# Patient Record
Sex: Female | Born: 1985 | Race: White | Hispanic: No | Marital: Single | Smoking: Former smoker
Health system: Southern US, Community
[De-identification: ages and names within clinical notes are randomized; demographics above are authoritative.]

## PROBLEM LIST (undated history)

## (undated) DIAGNOSIS — B1920 Unspecified viral hepatitis C without hepatic coma: Secondary | ICD-10-CM

## (undated) DIAGNOSIS — F419 Anxiety disorder, unspecified: Secondary | ICD-10-CM

## (undated) DIAGNOSIS — T7840XA Allergy, unspecified, initial encounter: Secondary | ICD-10-CM

## (undated) DIAGNOSIS — G43909 Migraine, unspecified, not intractable, without status migrainosus: Secondary | ICD-10-CM

## (undated) DIAGNOSIS — G8929 Other chronic pain: Secondary | ICD-10-CM

## (undated) HISTORY — DX: Unspecified viral hepatitis C without hepatic coma: B19.20

## (undated) HISTORY — DX: Anxiety disorder, unspecified: F41.9

## (undated) HISTORY — DX: Other chronic pain: G89.29

## (undated) HISTORY — DX: Migraine, unspecified, not intractable, without status migrainosus: G43.909

## (undated) HISTORY — DX: Allergy, unspecified, initial encounter: T78.40XA

## (undated) HISTORY — PX: APPENDECTOMY: SHX54

## (undated) HISTORY — PX: WISDOM TOOTH EXTRACTION: SHX21

---

## 2020-04-07 NOTE — Progress Notes (Signed)
New patient visit   Patient: Robin Whitaker   DOB: June 07, 1986   34 y.o. Female  MRN: [id number] Visit Date: 04/08/2020  Today's healthcare provider: Jairo BenMichelle Smith Elif Yonts, FNP   Chief Complaint  Patient presents with  . New Patient (Initial Visit)   Subjective    Robin Whitaker is a 34 y.o. female who presents today as a new patient to establish care.  History  of hepatitis C treated 3 years treated with [facility].  History of tattoo's got many in hotel rooms not sanitary and this was suspected to have caused.   She also has lower back pain chronically, she has history of kidney stones years ago.   She has some mild lower abdominal bloating. Last PAP unknown.  Denies any concerns for STD's at this time. Did have one previous unknown which one.   She will return here for PAP.   Denies alcohol. Quit smoking. Vapes occasionally for stress.   Patient  denies any fever, body aches,chills, rash, chest pain, shortness of breath, nausea, vomiting, or diarrhea.   Colonoscopy 1 year ago was normal per patient.  She had bloating then,records request.    Denies any loss of bowel or bladder control.  Denies saddle paresthesias.  Denies radiculopathy/ paresthesias.    Patient  denies any fever, chills, rash, chest pain, shortness of breath, nausea, vomiting, or diarrhea.  Denies dizziness, lightheadedness, pre syncopal or syncopal episodes.     Neck Pain  This is a chronic problem. The current episode started more than 1 year ago (woresned the past three months. Has seen chiropractor for neck pain for a couple of years. ). The problem occurs 2 to 4 times per day. The problem has been gradually worsening. The pain is associated with lifting a heavy object. The pain is present in the anterior neck. The quality of the pain is described as burning, cramping, shooting and aching (mostly aches ). The pain is at a severity of 6/10. The pain is moderate. The symptoms are aggravated by  bending and twisting. Stiffness is present in the morning. Associated symptoms include headaches (after neck pain persists and history of migraines ) and photophobia. Pertinent negatives include no chest pain, fever, leg pain, numbness, pain with swallowing, paresis, syncope, tingling, trouble swallowing, visual change, weakness or weight loss. Associated symptoms comments: scoliosis as child wore a brace only   She notices randomly she will see her pinky finger and occasionally her pointer finger moves on its own while driving.   She is using Gabapentin - she has had chronic numbness since her son was born. 2013. She has tried NSAIDs for the symptoms. The treatment provided no relief.      Past Medical History:  Diagnosis Date  . Allergy   . Anxiety   . Chronic back pain   . Migraine    Past Surgical History:  Procedure Laterality Date  . APPENDECTOMY    . WISDOM TOOTH EXTRACTION     Family Status  Relation Name Status  . Brother  (Not Specified)  . MGM  (Not Specified)  . MGF  (Not Specified)  . PGM  (Not Specified)   Family History  Problem Relation Age of Onset  . Leukemia Brother   . Arthritis Maternal Grandmother   . Anuerysm Maternal Grandfather   . Congestive Heart Failure Paternal Grandmother    Social History   Socioeconomic History  . Marital status: Single    Spouse name: Not on file  . Number  of children: Not on file  . Years of education: Not on file  . Highest education level: Not on file  Occupational History  . Not on file  Tobacco Use  . Smoking status: Never Smoker  . Smokeless tobacco: Current User  Substance and Sexual Activity  . Alcohol use: Not on file  . Drug use: Never  . Sexual activity: Not Currently    Partners: Male  Other Topics Concern  . Not on file  Social History Narrative  . Not on file   Social Determinants of Health   Financial Resource Strain:   . Difficulty of Paying Living Expenses:   Food Insecurity:   . Worried  About Programme researcher, broadcasting/film/video in the Last Year:   . Barista in the Last Year:   Transportation Needs:   . Freight forwarder (Medical):   Marland Kitchen Lack of Transportation (Non-Medical):   Physical Activity:   . Days of Exercise per Week:   . Minutes of Exercise per Session:   Stress:   . Feeling of Stress :   Social Connections:   . Frequency of Communication with Friends and Family:   . Frequency of Social Gatherings with Friends and Family:   . Attends Religious Services:   . Active Member of Clubs or Organizations:   . Attends Banker Meetings:   Marland Kitchen Marital Status:    No outpatient medications prior to visit.   No facility-administered medications prior to visit.   Allergies  Allergen Reactions  . Other     Trees, grass,dander     There is no immunization history on file for this patient.  Health Maintenance  Topic Date Due  . Hepatitis C Screening  Never done  . HIV Screening  Never done  . TETANUS/TDAP  Never done  . PAP SMEAR-Modifier  Never done  . INFLUENZA VACCINE  05/02/2020    Patient Care Team: Berniece Pap, FNP as PCP - General (Family Medicine)  Review of Systems  Constitutional: Positive for fatigue. Negative for fever and weight loss.  HENT: Negative for trouble swallowing.   Eyes: Positive for photophobia.  Cardiovascular: Negative for chest pain and syncope.  Gastrointestinal: Positive for abdominal distention and abdominal pain.  Genitourinary: Positive for flank pain and frequency.  Musculoskeletal: Positive for back pain, myalgias and neck pain.  Neurological: Positive for tremors and headaches (after neck pain persists and history of migraines ). Negative for tingling, weakness and numbness.  Psychiatric/Behavioral: Positive for sleep disturbance. The patient is nervous/anxious.       Objective    BP 110/72   Pulse 96   Temp (!) 96.8 F (36 C) (Oral)   Resp 16   Ht 5' 3.5" (1.613 m)   Wt 123 lb (55.8 kg)   LMP  03/13/2020 (Exact Date)   SpO2 100%   BMI 21.45 kg/m  Physical Exam Constitutional:      General: She is not in acute distress.    Appearance: She is normal weight. She is not ill-appearing, toxic-appearing or diaphoretic.  HENT:     Head: Normocephalic and atraumatic.     Right Ear: Tympanic membrane, ear canal and external ear normal. There is no impacted cerumen.     Left Ear: Tympanic membrane, ear canal and external ear normal. There is no impacted cerumen.     Nose: Nose normal. No congestion or rhinorrhea.     Mouth/Throat:     Mouth: Mucous membranes are moist.  Pharynx: No oropharyngeal exudate or posterior oropharyngeal erythema.  Eyes:     General: No scleral icterus.       Right eye: No discharge.        Left eye: No discharge.     Extraocular Movements: Extraocular movements intact.     Conjunctiva/sclera: Conjunctivae normal.     Pupils: Pupils are equal, round, and reactive to light.  Cardiovascular:     Rate and Rhythm: Normal rate and regular rhythm.     Pulses: Normal pulses.     Heart sounds: Normal heart sounds. No murmur heard.  No friction rub. No gallop.   Pulmonary:     Effort: Pulmonary effort is normal. No respiratory distress.     Breath sounds: Normal breath sounds. No stridor. No wheezing, rhonchi or rales.  Chest:     Chest wall: No tenderness.  Abdominal:     General: There is no distension.     Palpations: Abdomen is soft.     Tenderness: There is no abdominal tenderness.  Musculoskeletal:        General: Tenderness present. No swelling, deformity or signs of injury.     Cervical back: Normal range of motion and neck supple. Spasms and tenderness present. Normal range of motion.     Thoracic back: Tenderness present. No signs of trauma.     Lumbar back: Tenderness present. No edema. Normal range of motion. Negative right straight leg raise test and negative left straight leg raise test.     Right lower leg: Normal. No edema.     Left lower  leg: Normal. No edema.  Lymphadenopathy:     Cervical: No cervical adenopathy.     Lower Body: No right inguinal adenopathy. No left inguinal adenopathy.  Skin:    General: Skin is warm.     Capillary Refill: Capillary refill takes less than 2 seconds.     Comments: Very tiny 1 mm sized epidermal cyst suspected right anterior arm near wrist. No pain, erythema or drainage.   Neurological:     Mental Status: She is alert and oriented to person, place, and time.     Deep Tendon Reflexes:     Reflex Scores:      Tricep reflexes are 2+ on the right side and 2+ on the left side.      Brachioradialis reflexes are 2+ on the right side and 2+ on the left side.      Achilles reflexes are 2+ on the right side and 2+ on the left side. Psychiatric:        Mood and Affect: Mood is anxious.        Behavior: Behavior normal.        Thought Content: Thought content normal.        Judgment: Judgment normal.     Depression Screen PHQ 2/9 Scores 04/08/2020  PHQ - 2 Score 1  PHQ- 9 Score 5   Results for orders placed or performed in visit on 04/08/20  POCT urinalysis dipstick  Result Value Ref Range   Color, UA yellow    Clarity, UA clear    Glucose, UA Negative Negative   Bilirubin, UA negative    Ketones, UA negative    Spec Grav, UA 1.015 1.010 - 1.025   Blood, UA non hemolyzed trace    pH, UA 6.5 5.0 - 8.0   Protein, UA Negative Negative   Urobilinogen, UA 0.2 0.2 or 1.0 E.U./dL   Nitrite, UA negative  Leukocytes, UA Negative Negative   Appearance \    Odor      Assessment & Plan       Problem List Items Addressed This Visit      Cardiovascular and Mediastinum   Migraine without status migrainosus, not intractable   Relevant Orders   Ambulatory referral to Neurology    Other Visit Diagnoses    Chronic neck pain    -  Primary   Relevant Orders   Comprehensive metabolic panel   CBC with Differential/Platelet   Lipid panel   TSH   DG Cervical Spine 2 or 3 views (Completed)    DG Thoracic Spine 2 View (Completed)   DG Lumbar Spine 2-3 Views (Completed)   Ambulatory referral to Orthopedic Surgery   Screening for blood or protein in urine       Relevant Orders   POCT urinalysis dipstick (Completed)   History of carpal tunnel syndrome       History of scoliosis       Relevant Orders   Ambulatory referral to Orthopedic Surgery   Scoliosis, unspecified scoliosis type, unspecified spinal region       Relevant Orders   DG Cervical Spine 2 or 3 views (Completed)   DG Thoracic Spine 2 View (Completed)   DG Lumbar Spine 2-3 Views (Completed)   Acute midline low back pain with bilateral sciatica       Relevant Orders   Ambulatory referral to Orthopedic Surgery   Anxiety       Relevant Medications   hydrOXYzine (ATARAX/VISTARIL) 10 MG tablet     Orders Placed This Encounter  Procedures  . DG Cervical Spine 2 or 3 views    Order Specific Question:   Reason for Exam (SYMPTOM  OR DIAGNOSIS REQUIRED)    Answer:   cervical pain chronic    Order Specific Question:   Is patient pregnant?    Answer:   No    Order Specific Question:   Preferred imaging location?    Answer:   [facility]    Order Specific Question:   Radiology Contrast Protocol - do NOT remove file path    Answer:   \\[email or web address]\DXFluoroContrastProtocols.[email or web address]  . DG Thoracic Spine 2 View    Order Specific Question:   Reason for Exam (SYMPTOM  OR DIAGNOSIS REQUIRED)    Answer:   history scoliosis worse pain    Order Specific Question:   Is patient pregnant?    Answer:   No    Order Specific Question:   Preferred imaging location?    Answer:   [facility]    Order Specific Question:   Radiology Contrast Protocol - do NOT remove file path    Answer:   \\[email or web address]\Radiant\DXFluoroContrastProtocols.[email or web address]  . DG Lumbar Spine 2-3 Views    Order Specific Question:   Reason for Exam (SYMPTOM  OR DIAGNOSIS REQUIRED)    Answer:   history of scoliosis.    Order  Specific Question:   Is patient pregnant?    Answer:   No    Order Specific Question:   Preferred imaging location?    Answer:   [facility]    Order Specific Question:   Radiology Contrast Protocol - do NOT remove file path    Answer:   \\[email or web address]\Radiant\DXFluoroContrastProtocols.pdf  . Comprehensive metabolic panel  . CBC with Differential/Platelet  . Lipid panel  . TSH  . Ambulatory referral to Orthopedic Surgery    Referral Priority:   Urgent    Referral  Type:   Surgical    Referral Reason:   Specialty Services Required    Requested Specialty:   Orthopedic Surgery    Number of Visits Requested:   1  . Ambulatory referral to Neurology    Referral Priority:   Routine    Referral Type:   Consultation    Referral Reason:   Patient Preference    Referred to Provider:   Lonell Face, MD    Requested Specialty:   Neurology    Number of Visits Requested:   1  . POCT urinalysis dipstick    Meds ordered this encounter  Medications  . hydrOXYzine (ATARAX/VISTARIL) 10 MG tablet    Sig: Take 1 tablet (10 mg total) by mouth 3 (three) times daily as needed for anxiety (may cause drowsiness).    Dispense:  30 tablet    Refill:  0   As above for anxiety.    Red Flags discussed. The patient was given clear instructions to go to ER or return to medical center if any red flags develop, symptoms do not improve, worsen or new problems develop. They verbalized understanding.  For full CPE and PAP.Marland Kitchen Return in about 1 month (around 05/09/2020), or if symptoms worsen or fail to improve, for at any time for any worsening symptoms, Go to Emergency room/ urgent care if worse.     IBeverely Pace Gillis Boardley, FNP, have reviewed all documentation for this visit. The documentation on 04/09/20 for the exam, diagnosis, procedures, and orders are all accurate and complete.   Jairo Ben, FNP  [facility] [phone] (phone) [phone] (fax)  [facility]

## 2020-04-08 ENCOUNTER — Ambulatory Visit (INDEPENDENT_AMBULATORY_CARE_PROVIDER_SITE_OTHER): Payer: Medicaid Other | Admitting: Adult Health

## 2020-04-08 ENCOUNTER — Encounter: Payer: Self-pay | Admitting: Adult Health

## 2020-04-08 ENCOUNTER — Other Ambulatory Visit: Payer: Self-pay

## 2020-04-08 ENCOUNTER — Ambulatory Visit
Admission: RE | Admit: 2020-04-08 | Discharge: 2020-04-08 | Disposition: A | Discharge status: Home / Self Care | Payer: Medicaid Other | Attending: Adult Health | Admitting: Adult Health

## 2020-04-08 ENCOUNTER — Ambulatory Visit
Admission: RE | Admit: 2020-04-08 | Discharge: 2020-04-08 | Disposition: A | Discharge status: Home / Self Care | Payer: Medicaid Other | Source: Ambulatory Visit | Attending: Adult Health | Admitting: Adult Health

## 2020-04-08 VITALS — BP 110/72 | HR 96 | Temp 96.8°F | Resp 16 | Ht 63.5 in | Wt 123.0 lb

## 2020-04-08 DIAGNOSIS — M419 Scoliosis, unspecified: Secondary | ICD-10-CM | POA: Diagnosis present

## 2020-04-08 DIAGNOSIS — Z8739 Personal history of other diseases of the musculoskeletal system and connective tissue: Secondary | ICD-10-CM | POA: Diagnosis not present

## 2020-04-08 DIAGNOSIS — M5442 Lumbago with sciatica, left side: Secondary | ICD-10-CM

## 2020-04-08 DIAGNOSIS — G8929 Other chronic pain: Secondary | ICD-10-CM | POA: Insufficient documentation

## 2020-04-08 DIAGNOSIS — M5441 Lumbago with sciatica, right side: Secondary | ICD-10-CM

## 2020-04-08 DIAGNOSIS — M542 Cervicalgia: Secondary | ICD-10-CM | POA: Diagnosis present

## 2020-04-08 DIAGNOSIS — Z8669 Personal history of other diseases of the nervous system and sense organs: Secondary | ICD-10-CM

## 2020-04-08 DIAGNOSIS — Z1389 Encounter for screening for other disorder: Secondary | ICD-10-CM | POA: Diagnosis not present

## 2020-04-08 DIAGNOSIS — F419 Anxiety disorder, unspecified: Secondary | ICD-10-CM

## 2020-04-08 DIAGNOSIS — G43909 Migraine, unspecified, not intractable, without status migrainosus: Secondary | ICD-10-CM

## 2020-04-08 LAB — POCT URINALYSIS DIPSTICK
Bilirubin, UA: NEGATIVE
Glucose, UA: NEGATIVE
Ketones, UA: NEGATIVE
Leukocytes, UA: NEGATIVE
Nitrite, UA: NEGATIVE
Protein, UA: NEGATIVE
Spec Grav, UA: 1.015 (ref 1.010–1.025)
Urobilinogen, UA: 0.2 E.U./dL
pH, UA: 6.5 (ref 5.0–8.0)

## 2020-04-08 MED ORDER — HYDROXYZINE HCL 10 MG PO TABS: 10.0000 mg | ORAL_TABLET | Freq: Three times a day (TID) | ORAL | 0 refills | Status: DC | PRN

## 2020-04-08 NOTE — Patient Instructions (Addendum)
Hydroxyzine capsules or tablets What is this medicine? HYDROXYZINE (hye Chelsea i zeen) is an antihistamine. This medicine is used to treat allergy symptoms. It is also used to treat anxiety and tension. This medicine can be used with other medicines to induce sleep before surgery. This medicine may be used for other purposes; ask your health care provider or pharmacist if you have questions. COMMON BRAND NAME(S): ANX, Atarax, Rezine, Vistaril What should I tell my health care provider before I take this medicine? They need to know if you have any of these conditions:  glaucoma  heart disease  history of irregular heartbeat  kidney disease  liver disease  lung or breathing disease, like asthma  stomach or intestine problems  thyroid disease  trouble passing urine  an unusual or allergic reaction to hydroxyzine, cetirizine, other medicines, foods, dyes or preservatives  pregnant or trying to get pregnant  breast-feeding How should I use this medicine? Take this medicine by mouth with a full glass of water. Follow the directions on the prescription label. You may take this medicine with food or on an empty stomach. Take your medicine at regular intervals. Do not take your medicine more often than directed. Talk to your pediatrician regarding the use of this medicine in children. Special care may be needed. While this drug may be prescribed for children as young as 38 years of age for selected conditions, precautions do apply. Patients over 15 years old may have a stronger reaction and need a smaller dose. Overdosage: If you think you have taken too much of this medicine contact a poison control center or emergency room at once. NOTE: This medicine is only for you. Do not share this medicine with others. What if I miss a dose? If you miss a dose, take it as soon as you can. If it is almost time for your next dose, take only that dose. Do not take double or extra doses. What may  interact with this medicine? Do not take this medicine with any of the following medications:  cisapride  dronedarone  pimozide  thioridazine This medicine may also interact with the following medications:  alcohol  antihistamines for allergy, cough, and cold  atropine  barbiturate medicines for sleep or seizures, like phenobarbital  certain antibiotics like erythromycin or clarithromycin  certain medicines for anxiety or sleep  certain medicines for bladder problems like oxybutynin, tolterodine  certain medicines for depression or psychotic disturbances  certain medicines for irregular heart beat  certain medicines for Parkinson's disease like benztropine, trihexyphenidyl  certain medicines for seizures like phenobarbital, primidone  certain medicines for stomach problems like dicyclomine, hyoscyamine  certain medicines for travel sickness like scopolamine  ipratropium  narcotic medicines for pain  other medicines that prolong the QT interval (an abnormal heart rhythm) like dofetilide This list may not describe all possible interactions. Give your health care provider a list of all the medicines, herbs, non-prescription drugs, or dietary supplements you use. Also tell them if you smoke, drink alcohol, or use illegal drugs. Some items may interact with your medicine. What should I watch for while using this medicine? Tell your doctor or health care professional if your symptoms do not improve. You may get drowsy or dizzy. Do not drive, use machinery, or do anything that needs mental alertness until you know how this medicine affects you. Do not stand or sit up quickly, especially if you are an older patient. This reduces the risk of dizzy or fainting spells. Alcohol may  interfere with the effect of this medicine. Avoid alcoholic drinks. Your mouth may get dry. Chewing sugarless gum or sucking hard candy, and drinking plenty of water may help. Contact your doctor if the  problem does not go away or is severe. This medicine may cause dry eyes and blurred vision. If you wear contact lenses you may feel some discomfort. Lubricating drops may help. See your eye doctor if the problem does not go away or is severe. If you are receiving skin tests for allergies, tell your doctor you are using this medicine. What side effects may I notice from receiving this medicine? Side effects that you should report to your doctor or health care professional as soon as possible:  allergic reactions like skin rash, itching or hives, swelling of the face, lips, or tongue  changes in vision  confusion  fast, irregular heartbeat  seizures  tremor  trouble passing urine or change in the amount of urine Side effects that usually do not require medical attention (report to your doctor or health care professional if they continue or are bothersome):  constipation  drowsiness  dry mouth  headache  tiredness This list may not describe all possible side effects. Call your doctor for medical advice about side effects. You may report side effects to FDA at 1-800-FDA-1088. Where should I keep my medicine? Keep out of the reach of children. Store at room temperature between 15 and 30 degrees C (59 and 86 degrees F). Keep container tightly closed. Throw away any unused medicine after the expiration date. NOTE: This sheet is a summary. It may not cover all possible information. If you have questions about this medicine, talk to your doctor, pharmacist, or health care provider.  2020 Elsevier/Gold Standard (2018-09-09 13:19:55) Cervical Radiculopathy  Cervical radiculopathy means that a nerve in the neck (a cervical nerve) is pinched or bruised. This can happen because of an injury to the cervical spine (vertebrae) in the neck, or as a normal part of getting older. This can cause pain or loss of feeling (numbness) that runs from your neck all the way down to your arm and fingers.  Often, this condition gets better with rest. Treatment may be needed if the condition does not get better. What are the causes?  A neck injury.  A bulging disk in your spine.  Muscle movements that you cannot control (muscle spasms).  Tight muscles in your neck due to overuse.  Arthritis.  Breakdown in the bones and joints of the spine (spondylosis) due to getting older.  Bone spurs that form near the nerves in the neck. What are the signs or symptoms?  Pain. The pain may: ? Run from the neck to the arm and hand. ? Be very bad or irritating. ? Be worse when you move your neck.  Loss of feeling or tingling in your arm or hand.  Weakness in your arm or hand, in very bad cases. How is this treated? In many cases, treatment is not needed for this condition. With rest, the condition often gets better over time. If treatment is needed, options may include:  Wearing a soft neck collar (cervical collar) for short periods of time, as told by your doctor.  Doing exercises (physical therapy) to strengthen your neck muscles.  Taking medicines.  Having shots (injections) in your spine, in very bad cases.  Having surgery. This may be needed if other treatments do not help. The type of surgery that is used depends on the cause of  your condition. Follow these instructions at home: If you have a soft neck collar:  Wear it as told by your doctor. Remove it only as told by your doctor.  Ask your doctor if you can remove the collar for cleaning and bathing. If you are allowed to remove the collar for cleaning or bathing: ? Follow instructions from your doctor about how to remove the collar safely. ? Clean the collar by wiping it with mild soap and water and drying it completely. ? Take out any removable pads in the collar every 1-2 days. Wash them by hand with soap and water. Let them air-dry completely before you put them back in the collar. ? Check your skin under the collar for redness  or sores. If you see any, tell your doctor. Managing pain      Take over-the-counter and prescription medicines only as told by your doctor.  If told, put ice on the painful area. ? If you have a soft neck collar, remove it as told by your doctor. ? Put ice in a plastic bag. ? Place a towel between your skin and the bag. ? Leave the ice on for 20 minutes, 2-3 times a day.  If using ice does not help, you can try using heat. Use the heat source that your doctor recommends, such as a moist heat pack or a heating pad. ? Place a towel between your skin and the heat source. ? Leave the heat on for 20-30 minutes. ? Remove the heat if your skin turns bright red. This is very important if you are unable to feel pain, heat, or cold. You may have a greater risk of getting burned.  You may try a gentle neck and shoulder rub (massage). Activity  Rest as needed.  Return to your normal activities as told by your doctor. Ask your doctor what activities are safe for you.  Do exercises as told by your doctor or physical therapist.  Do not lift anything that is heavier than 10 lb (4.5 kg) until your doctor tells you that it is safe. General instructions  Use a flat pillow when you sleep.  Do not drive while wearing a soft neck collar. If you do not have a soft neck collar, ask your doctor if it is safe to drive while your neck heals.  Ask your doctor if the medicine prescribed to you requires you to avoid driving or using heavy machinery.  Do not use any products that contain nicotine or tobacco, such as cigarettes, e-cigarettes, and chewing tobacco. These can delay healing. If you need help quitting, ask your doctor.  Keep all follow-up visits as told by your doctor. This is important. Contact a doctor if:  Your condition does not get better with treatment. Get help right away if:  Your pain gets worse and is not helped with medicine.  You lose feeling or feel weak in your hand, arm,  face, or leg.  You have a high fever.  You have a stiff neck.  You cannot control when you poop or pee (have incontinence).  You have trouble with walking, balance, or talking. Summary  Cervical radiculopathy means that a nerve in the neck is pinched or bruised.  A nerve can get pinched from a bulging disk, arthritis, an injury to the neck, or other causes.  Symptoms include pain, tingling, or loss of feeling that goes from the neck into the arm or hand.  Weakness in your arm or hand can happen  in very bad cases.  Treatment may include resting, wearing a soft neck collar, and doing exercises. You might need to take medicines for pain. In very bad cases, shots or surgery may be needed. This information is not intended to replace advice given to you by your health care provider. Make sure you discuss any questions you have with your health care provider. Document Revised: 08/09/2018 Document Reviewed: 08/09/2018 Elsevier Patient Education  2020 [employer]. Social Anxiety Disorder, Adult Social anxiety disorder (SAD), previously called social phobia, is a mental health condition. People with SAD often feel nervous, afraid, or embarrassed when they are around other people in social situations. They worry that other people are judging or criticizing them for how they look, what they say, or how they act. SAD involves more than just feeling shy or self-conscious at times. It can cause severe emotional distress. It can interfere with activities of daily life. SAD also may lead to alcohol or drug use, and even suicide. SAD is a common mental health condition. It can develop at any time, but it usually starts in the teenage years. What are the causes? The cause of this condition is not known. It may involve genes that are passed through families. Stressful events may trigger anxiety. This disorder is also associated with an overactive amygdala. The amygdala is the part of the brain that  triggers your response to strong feelings, such as fear. What increases the risk? This condition is more likely to develop in:  People who have a family history of anxiety disorders.  Women.  People who have a physical or behavioral condition that makes them feel self-conscious or nervous, such as a stutter or a long-term (chronic) disease. What are the signs or symptoms? The main symptom of this condition is fear of embarrassment caused by being criticized or judged in social situations. You may be afraid to:  Speak in public.  Go shopping.  Use a public bathroom.  Eat at a restaurant.  Go to work.  Interact with people you do not know. Extreme fear and anxiety may cause physical symptoms, including:  Blushing.  A fast heartbeat.  Sweating.  Shaky hands or voice.  Confusion.  Light-headedness.  Upset stomach, diarrhea, or vomiting.  Shortness of breath. How is this diagnosed? This condition is diagnosed based on your history, symptoms, and behavior in social situations. You may be diagnosed with this type of anxiety if your symptoms have lasted for more than 6 months and have been present on more days than not. Your health care provider may ask you about your use of alcohol, drugs, and prescription medicines. He or she may also refer you to a mental health specialist for further evaluation or treatment. How is this treated? Treatment for this condition may include:  Cognitive behavioral therapy (CBT). This type of talk therapy helps you learn to replace negative thoughts and behaviors with positive ones. This may include learning how to use self-calming skills and other methods of managing your anxiety.  Exposure therapy. You will be exposed to social situations that cause you fear. The treatment starts with practicing self-calming in situations that cause you low levels of fear. Over time, you will progress by sustaining self-calming and managing harder  situations.  Antidepressant medicines. These medicines may be used by themselves or in addition to other therapies.  Biofeedback. This process trains you to manage your body's response (physiological response) through breathing techniques and relaxation methods. You will work with a Paramedic while  machines are used to monitor your physical symptoms.  Techniques for relaxation and managing anxiety. These include deep breathing, self-talk, meditation, visual imagery, muscle relaxation, music therapy, and yoga. These techniques are often used with other therapies to keep you calm in situations that cause you anxiety. These treatments are often used in combination. Follow these instructions at home: Alcohol use If you drink alcohol:  Limit how much you use to: ? 0-1 drink a day for nonpregnant women. ? 0-2 drinks a day for men.  Be aware of how much alcohol is in your drink. In the [place] one drink equals one 12 oz bottle of beer (355 mL), one 5 oz glass of wine (148 mL), or one 1 oz glass of hard liquor (44 mL). General instructions  Take over-the-counter and prescription medicines only as told by your health care provider.  Practice techniques for relaxation and managing anxiety at times you are not challenged by social anxiety.  Return to social activities using techniques you have learned, as you feel ready to do so.  Avoid caffeine and certain over-the-counter cold medicines. These may make you feel worse. Ask your pharmacists which medicines to avoid.  Keep all follow-up visits as told by your health care provider. This is important. Where to find more information  The [employer] on Mental Illness (NAMI): https://www.nami.org  Social Anxiety Association: https://socialphobia.org  Mental Health America [facility]): https://www.stevens-henderson.com/  Anxiety and Depression Association of [place] (ADAA): http://miller-hamilton.net/ Contact a health care provider if:  Your symptoms do not improve  or get worse.  You have signs of depression, such as: ? Persistent sadness or moodiness. ? Loss of enjoyment in activities that used to bring you joy. ? Change in weight or eating. ? Changes in sleeping habits. ? Avoiding friends or family members more than usual. ? Loss of energy for normal tasks. ? Feeling guilty or worthless.  You become more isolated than you normally are.  You find it more and more difficult to speak or interact with others.  You are using drugs.  You are drinking more alcohol than usual. Get help right away if:  You harm yourself.  You have suicidal thoughts. If you ever feel like you may hurt yourself or others, or have thoughts about taking your own life, get help right away. You can go to your nearest emergency department or call:  Your local emergency services (911 in the [place]  A suicide crisis helpline, such as the National Suicide Prevention Lifeline at (380) 655-8725. This is open 24 hours a day. Summary  Social anxiety disorder (SAD) may cause you to feel nervous, afraid, or embarrassed when you are around other people in social situations.  SAD is a common mental disorder. It can develop at any time, but it usually starts in the teenage years.  Treatment includes talk therapy, exposure therapy, medicines, biofeedback, and relaxation techniques. It can involve a combination of treatments. This information is not intended to replace advice given to you by your health care provider. Make sure you discuss any questions you have with your health care provider. Document Revised: 02/19/2019 Document Reviewed: 02/19/2019 Elsevier Patient Education  2020 [employer]. Health Maintenance, Female Adopting a healthy lifestyle and getting preventive care are important in promoting health and wellness. Ask your health care provider about:  The right schedule for you to have regular tests and exams.  Things you can do on your own to prevent diseases and  keep yourself healthy. What should I know about diet, weight,  and exercise? Eat a healthy diet   Eat a diet that includes plenty of vegetables, fruits, low-fat dairy products, and lean protein.  Do not eat a lot of foods that are high in solid fats, added sugars, or sodium. Maintain a healthy weight Body mass index (BMI) is used to identify weight problems. It estimates body fat based on height and weight. Your health care provider can help determine your BMI and help you achieve or maintain a healthy weight. Get regular exercise Get regular exercise. This is one of the most important things you can do for your health. Most adults should:  Exercise for at least 150 minutes each week. The exercise should increase your heart rate and make you sweat (moderate-intensity exercise).  Do strengthening exercises at least twice a week. This is in addition to the moderate-intensity exercise.  Spend less time sitting. Even light physical activity can be beneficial. Watch cholesterol and blood lipids Have your blood tested for lipids and cholesterol at 32 years of age, then have this test every 5 years. Have your cholesterol levels checked more often if:  Your lipid or cholesterol levels are high.  You are older than 32 years of age.  You are at high risk for heart disease. What should I know about cancer screening? Depending on your health history and family history, you may need to have cancer screening at various ages. This may include screening for:  Breast cancer.  Cervical cancer.  Colorectal cancer.  Skin cancer.  Lung cancer. What should I know about heart disease, diabetes, and high blood pressure? Blood pressure and heart disease  High blood pressure causes heart disease and increases the risk of stroke. This is more likely to develop in people who have high blood pressure readings, are of African descent, or are overweight.  Have your blood pressure checked: ? Every 3-5  years if you are 3918-32 years of age. ? Every year if you are 32 years old or older. Diabetes Have regular diabetes screenings. This checks your fasting blood sugar level. Have the screening done:  Once every three years after age 32 if you are at a normal weight and have a low risk for diabetes.  More often and at a younger age if you are overweight or have a high risk for diabetes. What should I know about preventing infection? Hepatitis B If you have a higher risk for hepatitis B, you should be screened for this virus. Talk with your health care provider to find out if you are at risk for hepatitis B infection. Hepatitis C Testing is recommended for:  Everyone born from [id number] through 1963.  Anyone with known risk factors for hepatitis C. Sexually transmitted infections (STIs)  Get screened for STIs, including gonorrhea and chlamydia, if: ? You are sexually active and are younger than 32 years of age. ? You are older than 32 years of age and your health care provider tells you that you are at risk for this type of infection. ? Your sexual activity has changed since you were last screened, and you are at increased risk for chlamydia or gonorrhea. Ask your health care provider if you are at risk.  Ask your health care provider about whether you are at high risk for HIV. Your health care provider may recommend a prescription medicine to help prevent HIV infection. If you choose to take medicine to prevent HIV, you should first get tested for HIV. You should then be tested every 3 months for  as long as you are taking the medicine. Pregnancy  If you are about to stop having your period (premenopausal) and you may become pregnant, seek counseling before you get pregnant.  Take 400 to 800 micrograms (mcg) of folic acid every day if you become pregnant.  Ask for birth control (contraception) if you want to prevent pregnancy. Osteoporosis and menopause Osteoporosis is a disease in which the  bones lose minerals and strength with aging. This can result in bone fractures. If you are 81 years old or older, or if you are at risk for osteoporosis and fractures, ask your health care provider if you should:  Be screened for bone loss.  Take a calcium or vitamin D supplement to lower your risk of fractures.  Be given hormone replacement therapy (HRT) to treat symptoms of menopause. Follow these instructions at home: Lifestyle  Do not use any products that contain nicotine or tobacco, such as cigarettes, e-cigarettes, and chewing tobacco. If you need help quitting, ask your health care provider.  Do not use street drugs.  Do not share needles.  Ask your health care provider for help if you need support or information about quitting drugs. Alcohol use  Do not drink alcohol if: ? Your health care provider tells you not to drink. ? You are pregnant, may be pregnant, or are planning to become pregnant.  If you drink alcohol: ? Limit how much you use to 0-1 drink a day. ? Limit intake if you are breastfeeding.  Be aware of how much alcohol is in your drink. In the [place] one drink equals one 12 oz bottle of beer (355 mL), one 5 oz glass of wine (148 mL), or one 1 oz glass of hard liquor (44 mL). General instructions  Schedule regular health, dental, and eye exams.  Stay current with your vaccines.  Tell your health care provider if: ? You often feel depressed. ? You have ever been abused or do not feel safe at home. Summary  Adopting a healthy lifestyle and getting preventive care are important in promoting health and wellness.  Follow your health care provider's instructions about healthy diet, exercising, and getting tested or screened for diseases.  Follow your health care provider's instructions on monitoring your cholesterol and blood pressure. This information is not intended to replace advice given to you by your health care provider. Make sure you discuss any  questions you have with your health care provider. Document Revised: 09/11/2018 Document Reviewed: 09/11/2018 Elsevier Patient Education  2020 [employer]vier Inc. Epidermal Cyst  An epidermal cyst is a sac made of skin tissue. The sac contains a substance called keratin. Keratin is a protein that is normally secreted through the hair follicles. When keratin becomes trapped in the top layer of skin (epidermis), it can form an epidermal cyst. Epidermal cysts can be found anywhere on your body. These cysts are usually harmless (benign), and they may not cause symptoms unless they become infected. What are the causes? This condition may be caused by:  A blocked hair follicle.  A hair that curls and re-enters the skin instead of growing straight out of the skin (ingrown hair).  A blocked pore.  Irritated skin.  An injury to the skin.  Certain conditions that are passed along from parent to child (inherited).  Human papillomavirus (HPV).  Long-term (chronic) sun damage to the skin. What increases the risk? The following factors may make you more likely to develop an epidermal cyst:  Having acne.  Being  overweight.  Being 29-49 years old. What are the signs or symptoms? The only symptom of this condition may be a small, painless lump underneath the skin. When an epidermal cyst ruptures, it may become infected. Symptoms may include:  Redness.  Inflammation.  Tenderness.  Warmth.  Fever.  Keratin draining from the cyst. Keratin is grayish-white, bad-smelling substance.  Pus draining from the cyst. How is this diagnosed? This condition is diagnosed with a physical exam.  In some cases, you may have a sample of tissue (biopsy) taken from your cyst to be examined under a microscope or tested for bacteria.  You may be referred to a health care provider who specializes in skin care (dermatologist). How is this treated? In many cases, epidermal cysts go away on their own without  treatment. If a cyst becomes infected, treatment may include:  Opening and draining the cyst, done by a health care provider. After draining, minor surgery to remove the rest of the cyst may be done.  Antibiotic medicine.  Injections of medicines (steroids) that help to reduce inflammation.  Surgery to remove the cyst. Surgery may be done if the cyst: ? Becomes large. ? Bothers you. ? Has a chance of turning into cancer.  Do not try to open a cyst yourself. Follow these instructions at home:  Take over-the-counter and prescription medicines only as told by your health care provider.  If you were prescribed an antibiotic medicine, take it it as told by your health care provider. Do not stop using the antibiotic even if you start to feel better.  Keep the area around your cyst clean and dry.  Wear loose, dry clothing.  Avoid touching your cyst.  Check your cyst every day for signs of infection. Check for: ? Redness, swelling, or pain. ? Fluid or blood. ? Warmth. ? Pus or a bad smell.  Keep all follow-up visits as told by your health care provider. This is important. How is this prevented?  Wear clean, dry, clothing.  Avoid wearing tight clothing.  Keep your skin clean and dry. Take showers or baths every day. Contact a health care provider if:  Your cyst develops symptoms of infection.  Your condition is not improving or is getting worse.  You develop a cyst that looks different from other cysts you have had.  You have a fever. Get help right away if:  Redness spreads from the cyst into the surrounding area. Summary  An epidermal cyst is a sac made of skin tissue. These cysts are usually harmless (benign), and they may not cause symptoms unless they become infected.  If a cyst becomes infected, treatment may include surgery to open and drain the cyst, or to remove it. Treatment may also include medicines by mouth or through an injection.  Take over-the-counter  and prescription medicines only as told by your health care provider. If you were prescribed an antibiotic medicine, take it as told by your health care provider. Do not stop using the antibiotic even if you start to feel better.  Contact a health care provider if your condition is not improving or is getting worse.  Keep all follow-up visits as told by your health care provider. This is important. This information is not intended to replace advice given to you by your health care provider. Make sure you discuss any questions you have with your health care provider. Document Revised: 01/09/2019 Document Reviewed: 04/01/2018 Elsevier Patient Education  2020 [employer].

## 2020-04-09 ENCOUNTER — Encounter: Payer: Self-pay | Admitting: Adult Health

## 2020-04-09 DIAGNOSIS — G8929 Other chronic pain: Secondary | ICD-10-CM | POA: Insufficient documentation

## 2020-04-09 DIAGNOSIS — F419 Anxiety disorder, unspecified: Secondary | ICD-10-CM | POA: Insufficient documentation

## 2020-04-09 DIAGNOSIS — Z8739 Personal history of other diseases of the musculoskeletal system and connective tissue: Secondary | ICD-10-CM | POA: Insufficient documentation

## 2020-04-09 DIAGNOSIS — M5442 Lumbago with sciatica, left side: Secondary | ICD-10-CM | POA: Insufficient documentation

## 2020-04-09 DIAGNOSIS — M5441 Lumbago with sciatica, right side: Secondary | ICD-10-CM | POA: Insufficient documentation

## 2020-04-09 DIAGNOSIS — M419 Scoliosis, unspecified: Secondary | ICD-10-CM | POA: Insufficient documentation

## 2020-04-09 DIAGNOSIS — Z1389 Encounter for screening for other disorder: Secondary | ICD-10-CM | POA: Insufficient documentation

## 2020-04-12 ENCOUNTER — Other Ambulatory Visit: Payer: Self-pay | Admitting: Adult Health

## 2020-04-12 ENCOUNTER — Telehealth: Payer: Self-pay

## 2020-04-12 DIAGNOSIS — M62838 Other muscle spasm: Secondary | ICD-10-CM

## 2020-04-12 MED ORDER — CYCLOBENZAPRINE HCL 10 MG PO TABS
10.0000 mg | ORAL_TABLET | Freq: Every evening | ORAL | 0 refills | Status: DC | PRN
Start: 2020-04-12 — End: 2021-06-28

## 2020-04-12 NOTE — Telephone Encounter (Deleted)
-----   Message from Michelle S Flinchum, FNP sent at 04/09/2020  4:29 PM EDT ----- Cervical x ray with muscle spasm of neck.  Thoracic with mild scoliosis.  Lumbar mild scoliosis.   Try muscle relaxer that was given at office visit and keep follow up with orthopedics as referred for chronic pain spine.  ----- Message ----- From: Interface, Rad Results In Sent: 04/09/2020   3:50 PM EDT To: Michelle S Flinchum, FNP  

## 2020-04-12 NOTE — Telephone Encounter (Signed)
-----   Message from Berniece Pap, FNP sent at 04/09/2020  4:29 PM EDT ----- Cervical x ray with muscle spasm of neck.  Thoracic with mild scoliosis.  Lumbar mild scoliosis.   Try muscle relaxer that was given at office visit and keep follow up with orthopedics as referred for chronic pain spine.  ----- Message ----- From: Interface, Rad Results In Sent: 04/09/2020   3:50 PM EDT To: Berniece Pap, FNP

## 2020-04-12 NOTE — Telephone Encounter (Signed)
Sent Flexeril 10mg  po qhs PRN to pharmacy.

## 2020-04-12 NOTE — Telephone Encounter (Signed)
Patient advised. She states muscle relaxer was not sent to [facility] pharmacy. Please send RX.

## 2020-04-12 NOTE — Telephone Encounter (Deleted)
-----   Message from Berniece Pap, FNP sent at 04/09/2020  4:29 PM EDT ----- Cervical x ray with muscle spasm of neck.  Thoracic with mild scoliosis.  Lumbar mild scoliosis.   Try muscle relaxer that was given at office visit and keep follow up with orthopedics as referred for chronic pain spine.  ----- Message ----- From: Interface, Rad Results In Sent: 04/09/2020   3:49 PM EDT To: Berniece Pap, FNP

## 2020-04-13 ENCOUNTER — Telehealth: Payer: Self-pay

## 2020-04-13 DIAGNOSIS — N912 Amenorrhea, unspecified: Secondary | ICD-10-CM

## 2020-04-13 NOTE — Telephone Encounter (Signed)
Spoke with patient on the phone and reassured her that U/A in office was normal there was no traces of bacteria or elevation in her white blood cell count. Patient reports that she is still feeling very bloated and feeling fatigued. Patient states that her last menstrual cycle was over 30 days ago but states that she is not sexually active and has no concerns of pregnancy. Patient has not went to lab yet to have labs drawn that you ordered. Did you want me to add any additional labs to these orders? KW

## 2020-04-13 NOTE — Telephone Encounter (Signed)
Left message for patient to call  Office back. KW

## 2020-04-13 NOTE — Telephone Encounter (Signed)
Ok to add FSH/ LH and  beta HCG lab diagnosis missed menses.  Follow up in office if symptoms persist advised.

## 2020-04-13 NOTE — Addendum Note (Signed)
Addended by: Fonda Kinder on: 04/13/2020 04:37 PM   Modules accepted: Orders

## 2020-04-13 NOTE — Telephone Encounter (Signed)
Copied from CRM [id number]. Topic: General - Other >> Apr 13, 2020 10:41 AM Dalphine Handing A wrote: Patient wanting a callback to go over urinalysis results. Please advise

## 2020-04-14 DIAGNOSIS — R519 Headache, unspecified: Secondary | ICD-10-CM | POA: Insufficient documentation

## 2020-04-19 ENCOUNTER — Other Ambulatory Visit: Payer: Self-pay | Admitting: Adult Health

## 2020-04-20 LAB — CBC WITH DIFFERENTIAL/PLATELET
Basophils Absolute: 0.1 10*3/uL (ref 0.0–0.2)
Basos: 1 %
EOS (ABSOLUTE): 0.3 10*3/uL (ref 0.0–0.4)
Eos: 3 %
Hematocrit: 36.8 % (ref 34.0–46.6)
Hemoglobin: 12.7 g/dL (ref 11.1–15.9)
Immature Grans (Abs): 0 10*3/uL (ref 0.0–0.1)
Immature Granulocytes: 0 %
Lymphocytes Absolute: 2 10*3/uL (ref 0.7–3.1)
Lymphs: 24 %
MCH: 32.1 pg (ref 26.6–33.0)
MCHC: 34.5 g/dL (ref 31.5–35.7)
MCV: 93 fL (ref 79–97)
Monocytes Absolute: 0.4 10*3/uL (ref 0.1–0.9)
Monocytes: 5 %
Neutrophils Absolute: 5.6 10*3/uL (ref 1.4–7.0)
Neutrophils: 67 %
Platelets: 303 10*3/uL (ref 150–450)
RBC: 3.96 x10E6/uL (ref 3.77–5.28)
RDW: 12 % (ref 11.7–15.4)
WBC: 8.4 10*3/uL (ref 3.4–10.8)

## 2020-04-20 LAB — COMPREHENSIVE METABOLIC PANEL
ALT: 11 IU/L (ref 0–32)
AST: 14 IU/L (ref 0–40)
Albumin/Globulin Ratio: 2 (ref 1.2–2.2)
Albumin: 4.5 g/dL (ref 3.8–4.8)
Alkaline Phosphatase: 70 IU/L (ref 48–121)
BUN/Creatinine Ratio: 7 — ABNORMAL LOW (ref 9–23)
BUN: 7 mg/dL (ref 6–20)
Bilirubin Total: <0.2 mg/dL (ref 0.0–1.2)
CO2: 24 mmol/L (ref 20–29)
Calcium: 9.6 mg/dL (ref 8.7–10.2)
Chloride: 100 mmol/L (ref 96–106)
Creatinine, Ser: 0.97 mg/dL (ref 0.57–1.00)
GFR calc Af Amer: 89 mL/min/{1.73_m2} (ref >59)
GFR calc non Af Amer: 78 mL/min/{1.73_m2} (ref >59)
Globulin, Total: 2.2 g/dL (ref 1.5–4.5)
Glucose: 108 mg/dL — ABNORMAL HIGH (ref 65–99)
Potassium: 4 mmol/L (ref 3.5–5.2)
Sodium: 141 mmol/L (ref 134–144)
Total Protein: 6.7 g/dL (ref 6.0–8.5)

## 2020-04-20 LAB — LIPID PANEL
Chol/HDL Ratio: 3.5 ratio (ref 0.0–4.4)
Cholesterol, Total: 196 mg/dL (ref 100–199)
HDL: 56 mg/dL (ref >39)
LDL Chol Calc (NIH): 113 mg/dL — ABNORMAL HIGH (ref 0–99)
Triglycerides: 154 mg/dL — ABNORMAL HIGH (ref 0–149)
VLDL Cholesterol Cal: 27 mg/dL (ref 5–40)

## 2020-04-20 LAB — BETA HCG QUANT (REF LAB): hCG Quant: <1 m[IU]/mL

## 2020-04-20 LAB — TSH: TSH: 0.728 u[IU]/mL (ref 0.450–4.500)

## 2020-04-20 LAB — FSH/LH
FSH: 5.3 m[IU]/mL
LH: 4.5 m[IU]/mL

## 2020-04-23 ENCOUNTER — Ambulatory Visit (INDEPENDENT_AMBULATORY_CARE_PROVIDER_SITE_OTHER): Payer: Medicaid Other | Admitting: Adult Health

## 2020-04-23 ENCOUNTER — Ambulatory Visit: Payer: Self-pay | Admitting: Adult Health

## 2020-04-23 DIAGNOSIS — M542 Cervicalgia: Secondary | ICD-10-CM

## 2020-04-23 NOTE — Progress Notes (Deleted)
° ° ° °  Established patient visit   Patient: Robin Whitaker   DOB: 1985-12-31   34 y.o. Female  MRN: [id number] Visit Date: 04/23/2020  Today's healthcare provider: Jairo Ben, FNP   No chief complaint on file.  Subjective    HPI  ***  {Show patient history (optional):23778::" "}   Medications: Outpatient Medications Prior to Visit  Medication Sig   cyclobenzaprine (FLEXERIL) 10 MG tablet Take 1 tablet (10 mg total) by mouth at bedtime as needed for muscle spasms.   hydrOXYzine (ATARAX/VISTARIL) 10 MG tablet Take 1 tablet (10 mg total) by mouth 3 (three) times daily as needed for anxiety (may cause drowsiness).   No facility-administered medications prior to visit.    Review of Systems  {Heme   Chem   Endocrine   Serology   Results Review (optional):23779::" "}  Objective    There were no vitals taken for this visit. {Show previous vital signs (optional):23777::" "}  Physical Exam  ***  No results found for any visits on 04/23/20.  Assessment & Plan     ***  No follow-ups on file.      {provider attestation***:1}   Jairo Ben, FNP  [facility] [phone] (phone) [phone] (fax)  [facility]

## 2020-04-23 NOTE — Progress Notes (Signed)
Virtual Visit via Telephone Note  I connected with Robin Whitaker on 04/25/20 at  9:20 AM EDT by telephone and verified that I am speaking with the correct person using two identifiers.    Virtual telephone visit    Virtual Visit via Telephone Note   This visit type was conducted due to national recommendations for restrictions regarding the COVID-19 Pandemic (e.g. social distancing) in an effort to limit this patient's exposure and mitigate transmission in our community. Due to her co-morbid illnesses, this patient is at least at moderate risk for complications without adequate follow up. This format is felt to be most appropriate for this patient at this time. The patient did not have access to video technology or had technical difficulties with video requiring transitioning to audio format only (telephone). Physical exam was limited to content and character of the telephone converstion.    Patient location: at home  Provider location: Provider: Provider's office at  [facility], [place].      Visit Date: 04/23/2020  Today's healthcare provider: Jairo Ben, FNP   Chief Complaint  Patient presents with   Neck Pain   Subjective    Neck Pain  Associated symptoms include headaches.  Denies any injury. She took Imitrex last night thinking she had a migraine, she reports she slept but then woke up with neck pain and headache. She denies any new injury. Denies any radiation.      Denies any loss of bowel or bladder control.  Denies saddle paresthesias.  Denies radiculopathy/ paresthesias.   She missed her orthopedics appointment yesterday with [facility], with Dr. Victoriano Lain.   She reports she took Flexeril " two tablets" this morning and Gabapentin as well as 800 mg ibuprofen and her neck is still hurting and she " needs something stronger".     Dr Iline Oven 04/22/20 at 3:00      IMPRESSION: Reversal of lordotic curvature, a finding  indicative of muscle spasm. No fracture or spondylolisthesis. No evident arthropathy.   Electronically Signed   By: Bretta Bang III M.D.   On: 04/09/2020 15:46    Patient  denies any fever,chills, rash, chest pain, shortness of breath, nausea, vomiting, or diarrhea.   Denies dizziness, lightheadedness, pre syncopal or syncopal episodes.    HPI    Neck Pain    This is a new problem.  There was not an injury that may have caused the pain.  Recent episode started yesterday.  The problem has been gradually worsening since onset.   Pain is present in the  base of cervical spine.  Radiating to: Top of head .  Severity of the pain is severe.  Pain occurs constantly.  The symptoms are aggravated by turning left, flexion , stress and turning right.  Symptoms are relieved by nothing.  Treatments: NSAIDs (Imitrex, Cyclobenzaprine).  Treatment provided no relief.  Chest pain:  Absent.  Fever: Absent.  Headaches: Present.  Joint pains: Absent.  Numbness: Absent.  Sore throat: Absent.  Swallowing problems: Absent.  Tingling: Present.  Weakness: Absent.       Last edited by Fonda Kinder, CMA on 04/23/2020  9:00 AM. (History)       Patient Active Problem List   Diagnosis Date Noted   Chronic neck pain 04/09/2020   Screening for blood or protein in urine 04/09/2020   History of scoliosis 04/09/2020   Scoliosis 04/09/2020   Acute midline low back pain with bilateral sciatica 04/09/2020   Anxiety  04/09/2020   Migraine without status migrainosus, not intractable 04/08/2020   Past Medical History:  Diagnosis Date   Allergy    Anxiety    Chronic back pain    Migraine    Social History   Tobacco Use   Smoking status: Never Smoker   Smokeless tobacco: Current User  Substance Use Topics   Alcohol use: Not on file   Drug use: Never   Allergies  Allergen Reactions   Other     Trees, grass,dander      Medications: Outpatient Medications Prior to Visit    Medication Sig   cyclobenzaprine (FLEXERIL) 10 MG tablet Take 1 tablet (10 mg total) by mouth at bedtime as needed for muscle spasms.   hydrOXYzine (ATARAX/VISTARIL) 10 MG tablet Take 1 tablet (10 mg total) by mouth 3 (three) times daily as needed for anxiety (may cause drowsiness).   No facility-administered medications prior to visit.    Review of Systems  Musculoskeletal: Positive for neck pain.  Neurological: Positive for headaches.      Objective    There were no vitals taken for this visit.   Phone visit only.   Patient is alert and oriented and responsive to questions Engages in conversation with provider. Speaks in full sentences without any pauses without any shortness of breath or distress.     Assessment & Plan     Advised since failed treatment, and missed orthopedics appointment to go to [facility] clinic today 1pm to 7pm.  declines prednisone.  Advised patient call the office or your primary care doctor for an appointment if no improvement within 72 hours or if any symptoms change or worsen at any time  Advised ER or urgent Care if after hours or on weekend. Call 911 for emergency symptoms at any time.Patinet verbalized understanding of all instructions given/reviewed and treatment plan and has no further questions or concerns at this time.      Return in about 2 weeks (around 05/07/2020) for at any time for any worsening symptoms, Go to Emergency room/ urgent care if worse.    I discussed the assessment and treatment plan with the patient. The patient was provided an opportunity to ask questions and all were answered. The patient agreed with the plan and demonstrated an understanding of the instructions.   The patient was advised to call back or seek an in-person evaluation if the symptoms worsen or if the condition fails to improve as anticipated.  I provided 25 minutes of non-face-to-face time during this encounter.   Advised patient call the  office or your primary care doctor for an appointment if no improvement within 72 hours or if any symptoms change or worsen at any time  Advised ER or urgent Care if after hours or on weekend. Call 911 for emergency symptoms at any time.Patinet verbalized understanding of all instructions given/reviewed and treatment plan and has no further questions or concerns at this time.     The entirety of the information documented in the History of Present Illness, Review of Systems and Physical Exam were personally obtained by me. Portions of this information were initially documented by the CMA and reviewed by me for thoroughness and accuracy.   Beverely Pace Briarrose Shor, FNP   Jairo Ben, FNP [facility] [phone] (phone) [phone] (fax)  [facility]

## 2020-04-25 ENCOUNTER — Encounter: Payer: Self-pay | Admitting: Adult Health

## 2020-04-25 NOTE — Progress Notes (Signed)
CBC within normal limits no signs of anemia or infection.  CMP has mildly elevated glucose at 108 recommend diet and lifestyle changes and increased exercise.    Electrolytes, kidney and liver function are within normal limits.   Triglycerides LDL elevated.  Discuss lifestyle modification with patient e.g. increase exercise, fiber, fruits, vegetables, lean meat, and omega 3/fish intake and decrease saturated fat.  If patient following strict diet and exercise program already please schedule follow up appointment with primary care physician.  Labs hormones are within normal limits for her age.  Blood pregnancy test is negative.  Recommend gynecology referral for gynecological care please place referral to area patient's preference. TSH her thyroid is on the low end normal.  Recheck recommended in 3 to 4 months with CMP, hemoglobin A1c and TSH.

## 2020-04-25 NOTE — Patient Instructions (Signed)
Musculoskeletal Pain Musculoskeletal pain refers to aches and pains in your bones, joints, muscles, and the tissues that surround them. This pain can occur in any part of the body. It can last for a short time (acute) or a long time (chronic). A physical exam, lab tests, and imaging studies may be done to find the cause of your musculoskeletal pain. Follow these instructions at home:  Lifestyle  Try to control or lower your stress levels. Stress increases muscle tension and can worsen musculoskeletal pain. It is important to recognize when you are anxious or stressed and learn ways to manage it. This may include: ? Meditation or yoga. ? Cognitive or behavioral therapy. ? Acupuncture or massage therapy.  You may continue all activities unless the activities cause more pain. When the pain gets better, slowly resume your normal activities. Gradually increase the intensity and duration of your activities or exercise. Managing pain, stiffness, and swelling  Take over-the-counter and prescription medicines only as told by your health care provider.  When your pain is severe, bed rest may be helpful. Lie or sit in any position that is comfortable, but get out of bed and walk around at least every couple of hours.  If directed, apply heat to the affected area as often as told by your health care provider. Use the heat source that your health care provider recommends, such as a moist heat pack or a heating pad. ? Place a towel between your skin and the heat source. ? Leave the heat on for 20-30 minutes. ? Remove the heat if your skin turns bright red. This is especially important if you are unable to feel pain, heat, or cold. You may have a greater risk of getting burned.  If directed, put ice on the painful area. ? Put ice in a plastic bag. ? Place a towel between your skin and the bag. ? Leave the ice on for 20 minutes, 2-3 times a day. General instructions  Your health care provider may  recommend that you see a physical therapist. This person can help you come up with a safe exercise program. Do any exercises as told by your physical therapist.  Keep all follow-up visits, including any physical therapy visits, as told by your health care providers. This is important. Contact a health care provider if:  Your pain gets worse.  Medicines do not help ease your pain.  You cannot use the part of your body that hurts, such as your arm, leg, or neck.  You have trouble sleeping.  You have trouble doing your normal activities. Get help right away if:  You have a new injury and your pain is worse or different.  You feel numb or you have tingling in the painful area. Summary  Musculoskeletal pain refers to aches and pains in your bones, joints, muscles, and the tissues that surround them.  This pain can occur in any part of the body.  Your health care provider may recommend that you see a physical therapist. This person can help you come up with a safe exercise program. Do any exercises as told by your physical therapist.  Lower your stress level. Stress can worsen musculoskeletal pain. Ways to lower stress may include meditation, yoga, cognitive or behavioral therapy, acupuncture, and massage therapy. This information is not intended to replace advice given to you by your health care provider. Make sure you discuss any questions you have with your health care provider. Document Revised: 08/31/2017 Document Reviewed: 10/18/2016 Elsevier Patient   Education  2020 [employer]  

## 2020-04-26 ENCOUNTER — Telehealth: Payer: Self-pay

## 2020-04-26 DIAGNOSIS — N912 Amenorrhea, unspecified: Secondary | ICD-10-CM

## 2020-04-26 NOTE — Telephone Encounter (Signed)
-----   Message from Berniece Pap, FNP sent at 04/25/2020  9:18 PM EDT ----- CBC within normal limits no signs of anemia or infection.  CMP has mildly elevated glucose at 108 recommend diet and lifestyle changes and increased exercise.    Electrolytes, kidney and liver function are within normal limits.   Triglycerides LDL elevated.  Discuss lifestyle modification with patient e.g. increase exercise, fiber, fruits, vegetables, lean meat, and omega 3/fish intake and decrease saturated fat.  If patient following strict diet and exercise program already please schedule follow up appointment with primary care physician.  Labs hormones are within normal limits for her age.  Blood pregnancy test is negative.  Recommend gynecology referral for gynecological care please place referral to area patient's preference. TSH her thyroid is on the low end normal.  Recheck recommended in 3 to 4 months with CMP, hemoglobin A1c and TSH.

## 2020-04-26 NOTE — Telephone Encounter (Signed)
Spoke with patient and advised her of lab results, she states that day of testing she was not fasting and had drank coffee. Patient encouraged to work on lifestyle changes such as diet and exercise. Patient states even though labs show her hormones or normally she feels that they aren't. Patient reports symptoms of being lethargic , very emotional and easily angered. KW

## 2020-04-27 NOTE — Telephone Encounter (Signed)
Spoke with patient on the phone and suggested o.v as advised below. Patient states that she feels something is wrong with her because she is so lethargic and states that she dosen't understand why her labs were normal. Patient stated on phone that she just got fired from her job because of her having difficulty functioning. I scheduled an appt with you at 3:40PM tomorrow. KW

## 2020-04-27 NOTE — Telephone Encounter (Signed)
Noted  

## 2020-04-27 NOTE — Telephone Encounter (Signed)
Follow up advised if symptoms new/ worsening since last visit.

## 2020-04-28 ENCOUNTER — Ambulatory Visit (INDEPENDENT_AMBULATORY_CARE_PROVIDER_SITE_OTHER): Payer: Medicaid Other | Admitting: Adult Health

## 2020-04-28 ENCOUNTER — Encounter: Payer: Self-pay | Admitting: Adult Health

## 2020-04-28 ENCOUNTER — Other Ambulatory Visit: Payer: Self-pay

## 2020-04-28 VITALS — BP 118/80 | HR 82 | Temp 96.8°F | Resp 16 | Wt 122.0 lb

## 2020-04-28 DIAGNOSIS — R7309 Other abnormal glucose: Secondary | ICD-10-CM

## 2020-04-28 DIAGNOSIS — R002 Palpitations: Secondary | ICD-10-CM

## 2020-04-28 DIAGNOSIS — R399 Unspecified symptoms and signs involving the genitourinary system: Secondary | ICD-10-CM

## 2020-04-28 DIAGNOSIS — M2550 Pain in unspecified joint: Secondary | ICD-10-CM

## 2020-04-28 DIAGNOSIS — R5383 Other fatigue: Secondary | ICD-10-CM | POA: Diagnosis not present

## 2020-04-28 DIAGNOSIS — E559 Vitamin D deficiency, unspecified: Secondary | ICD-10-CM

## 2020-04-28 DIAGNOSIS — F419 Anxiety disorder, unspecified: Secondary | ICD-10-CM

## 2020-04-28 DIAGNOSIS — W57XXXS Bitten or stung by nonvenomous insect and other nonvenomous arthropods, sequela: Secondary | ICD-10-CM

## 2020-04-28 DIAGNOSIS — R591 Generalized enlarged lymph nodes: Secondary | ICD-10-CM

## 2020-04-28 MED ORDER — VITAMIN D (ERGOCALCIFEROL) 1.25 MG (50000 UNIT) PO CAPS: 50000.0000 [IU] | ORAL_CAPSULE | ORAL | 0 refills | Status: DC

## 2020-04-28 NOTE — Patient Instructions (Signed)

## 2020-04-28 NOTE — Progress Notes (Signed)
Established patient visit   Patient: Robin Whitaker   DOB: [birth year]   34 y.o. Female  MRN: [id number] Visit Date: 04/28/2020  Today's healthcare provider: Jairo BenMichelle Smith Bary Limbach, FNP   Chief Complaint  Patient presents with  . Neck Pain   Subjective    HPI  Follow up for neck pain   The patient was last seen for this 5 days ago. Changes made at last visit include patient was sent to [facility] 04/27/20 for chronic neck pain. . Patient was prescribed z-pack and prednisone.  Neck is mildly improved over the weekend and has since returned. She reports today that she did have lymphadenopathy.  Fatigue is increasing she reports.   She reports fair compliance with treatment. She feels that condition is Unchanged. Maybe slightly improved. Patient reports today near syncope episodes due to extreme fatigue reported, tingling and numbness in fingertips on right hand, increased pain in right shoulder, increased appetite, nausea and decreased hearing. She is having side effects. No syncope.No loss of consciousness.   b12 was low 242 , and received B12 injection last week.  Vitamin D is low 26.7 - not on vitamin D.   Patient  denies any fever, body aches,chills, rash, chest pain, shortness of breath, nausea, vomiting, or diarrhea.  Denies dizziness, lightheadedness, pre syncopal or syncopal episodes.     -----------------------------------------------------------------------------------------   Patient Active Problem List   Diagnosis Date Noted  . Vitamin D insufficiency 04/30/2020  . Arthralgia 04/30/2020  . Fatigue 04/30/2020  . Elevated glucose 04/30/2020  . Tick bite 04/30/2020  . Urinary symptom or sign 04/30/2020  . Palpitations 04/30/2020  . Headache disorder 04/14/2020  . Chronic neck pain 04/09/2020  . Screening for blood or protein in urine 04/09/2020  . History of scoliosis 04/09/2020  . Scoliosis 04/09/2020  . Acute midline low back pain with bilateral  sciatica 04/09/2020  . Anxiety 04/09/2020  . Migraine without status migrainosus, not intractable 04/08/2020   Past Medical History:  Diagnosis Date  . Allergy   . Anxiety   . Chronic back pain   . Migraine    Past Surgical History:  Procedure Laterality Date  . APPENDECTOMY    . WISDOM TOOTH EXTRACTION     Allergies  Allergen Reactions  . Other     Trees, grass,dander       Medications: Outpatient Medications Prior to Visit  Medication Sig  . cyclobenzaprine (FLEXERIL) 10 MG tablet Take 1 tablet (10 mg total) by mouth at bedtime as needed for muscle spasms.  . hydrOXYzine (ATARAX/VISTARIL) 10 MG tablet Take 1 tablet (10 mg total) by mouth 3 (three) times daily as needed for anxiety (may cause drowsiness).  . nortriptyline (PAMELOR) 10 MG capsule Take 1 pill at night for one week then increase to 2 pills at night  . SUMAtriptan (IMITREX) 100 MG tablet Take by mouth.   No facility-administered medications prior to visit.    Review of Systems  Constitutional: Positive for fatigue. Negative for activity change, appetite change, chills, diaphoresis, fever and unexpected weight change.  HENT: Negative.   Respiratory: Negative.   Cardiovascular: Negative.   Gastrointestinal: Negative.   Genitourinary: Negative.   Musculoskeletal: Positive for arthralgias, myalgias and neck pain. Negative for neck stiffness.  Neurological: Positive for light-headedness. Negative for dizziness, seizures and speech difficulty.  Hematological: Positive for adenopathy. Does not bruise/bleed easily.  Psychiatric/Behavioral: Negative for agitation, behavioral problems, confusion, decreased concentration, dysphoric mood, hallucinations, self-injury, sleep disturbance and suicidal ideas. The  patient is nervous/anxious. The patient is not hyperactive.     Last CBC Lab Results  Component Value Date   WBC 8.4 04/19/2020   HGB 12.7 04/19/2020   HCT 36.8 04/19/2020   MCV 93 04/19/2020   MCH 32.1  04/19/2020   RDW 12.0 04/19/2020   PLT 303 04/19/2020   Last metabolic panel Lab Results  Component Value Date   GLUCOSE 108 (H) 04/19/2020   NA 141 04/19/2020   K 4.0 04/19/2020   CL 100 04/19/2020   CO2 24 04/19/2020   BUN 7 04/19/2020   CREATININE 0.97 04/19/2020   GFRNONAA 78 04/19/2020   GFRAA 89 04/19/2020   CALCIUM 9.6 04/19/2020   PROT 6.7 04/19/2020   ALBUMIN 4.5 04/19/2020   LABGLOB 2.2 04/19/2020   AGRATIO 2.0 04/19/2020   BILITOT <0.2 04/19/2020   ALKPHOS 70 04/19/2020   AST 14 04/19/2020   ALT 11 04/19/2020   Last lipids Lab Results  Component Value Date   CHOL 196 04/19/2020   HDL 56 04/19/2020   LDLCALC 113 (H) 04/19/2020   TRIG 154 (H) 04/19/2020   CHOLHDL 3.5 04/19/2020   Last hemoglobin A1c No results found for: HGBA1C Last thyroid functions Lab Results  Component Value Date   TSH 0.728 04/19/2020   Last vitamin D No results found for: 25OHVITD2, 25OHVITD3, VD25OH Last vitamin B12 and Folate No results found for: VITAMINB12, FOLATE    Objective    BP 118/80   Pulse 82   Temp (!) 96.8 F (36 C) (Oral)   Resp 16   Wt 122 lb (55.3 kg)   SpO2 100%   BMI 21.27 kg/m  BP Readings from Last 3 Encounters:  04/28/20 118/80  04/08/20 110/72      Physical Exam Vitals reviewed.  Constitutional:      General: She is not in acute distress.    Appearance: She is well-developed. She is not diaphoretic.     Interventions: She is not intubated. HENT:     Head: Normocephalic and atraumatic.     Right Ear: External ear normal.     Left Ear: External ear normal.     Nose: Nose normal.     Mouth/Throat:     Pharynx: No oropharyngeal exudate or posterior oropharyngeal erythema.  Eyes:     General: Lids are normal. No scleral icterus.       Right eye: No discharge.        Left eye: No discharge.     Conjunctiva/sclera: Conjunctivae normal.     Right eye: Right conjunctiva is not injected. No exudate or hemorrhage.    Left eye: Left  conjunctiva is not injected. No exudate or hemorrhage.    Pupils: Pupils are equal, round, and reactive to light.  Neck:     Thyroid: No thyroid mass or thyromegaly.     Vascular: Normal carotid pulses. No carotid bruit, hepatojugular reflux or JVD.     Trachea: Trachea and phonation normal. No tracheal tenderness or tracheal deviation.     Meningeal: Brudzinski's sign and Kernig's sign absent.  Cardiovascular:     Rate and Rhythm: Normal rate and regular rhythm.     Pulses: Normal pulses.          Radial pulses are 2+ on the right side and 2+ on the left side.       Dorsalis pedis pulses are 2+ on the right side and 2+ on the left side.       Posterior tibial  pulses are 2+ on the right side and 2+ on the left side.     Heart sounds: Normal heart sounds, S1 normal and S2 normal. Heart sounds not distant. No murmur heard.  No friction rub. No gallop.   Pulmonary:     Effort: Pulmonary effort is normal. No tachypnea, bradypnea, accessory muscle usage or respiratory distress. She is not intubated.     Breath sounds: Normal breath sounds. No stridor. No wheezing, rhonchi or rales.  Chest:     Chest wall: No tenderness.  Abdominal:     General: Bowel sounds are normal. There is no distension or abdominal bruit.     Palpations: Abdomen is soft. There is no shifting dullness, fluid wave, hepatomegaly, splenomegaly, mass or pulsatile mass.     Tenderness: There is no abdominal tenderness. There is no right CVA tenderness, left CVA tenderness, guarding or rebound.     Hernia: No hernia is present.  Musculoskeletal:        General: No tenderness or deformity. Normal range of motion.     Cervical back: Full passive range of motion without pain, normal range of motion and neck supple. No edema, erythema, rigidity or tenderness. No spinous process tenderness or muscular tenderness. Normal range of motion.  Lymphadenopathy:     Head:     Right side of head: No submental, submandibular, tonsillar,  preauricular, posterior auricular or occipital adenopathy.     Left side of head: No submental, submandibular, tonsillar, preauricular, posterior auricular or occipital adenopathy.     Cervical: No cervical adenopathy.     Right cervical: No superficial, deep or posterior cervical adenopathy.    Left cervical: No superficial, deep or posterior cervical adenopathy.     Upper Body:     Right upper body: No supraclavicular or pectoral adenopathy.     Left upper body: No supraclavicular or pectoral adenopathy.  Skin:    General: Skin is warm and dry.     Coloration: Skin is not pale.     Findings: No abrasion, bruising, burn, ecchymosis, erythema, lesion, petechiae or rash.     Nails: There is no clubbing.  Neurological:     Mental Status: She is alert and oriented to person, place, and time.     GCS: GCS eye subscore is 4. GCS verbal subscore is 5. GCS motor subscore is 6.     Cranial Nerves: No cranial nerve deficit.     Sensory: No sensory deficit.     Motor: No weakness, tremor, atrophy, abnormal muscle tone or seizure activity.     Coordination: Coordination normal.     Gait: Gait normal.     Deep Tendon Reflexes: Reflexes are normal and symmetric. Reflexes normal. Babinski sign absent on the right side. Babinski sign absent on the left side.     Reflex Scores:      Tricep reflexes are 2+ on the right side and 2+ on the left side.      Bicep reflexes are 2+ on the right side and 2+ on the left side.      Brachioradialis reflexes are 2+ on the right side and 2+ on the left side.      Patellar reflexes are 2+ on the right side and 2+ on the left side.      Achilles reflexes are 2+ on the right side and 2+ on the left side. Psychiatric:        Attention and Perception: Attention and perception normal.  Mood and Affect: Affect normal. Mood is anxious. Affect is not angry or inappropriate.        Speech: Speech normal.        Behavior: Behavior is hyperactive. Behavior is  cooperative.        Thought Content: Thought content normal.        Cognition and Memory: Cognition and memory normal.        Judgment: Judgment normal.      No results found for any visits on 04/28/20.  Assessment & Plan     The primary encounter diagnosis was Anxiety. Diagnoses of Vitamin D insufficiency, Arthralgia, unspecified joint, Fatigue, unspecified type, Elevated glucose, Tick bite, sequela, Urinary symptom or sign, Heart palpitations, Palpitations, and Lymphadenopathy were also pertinent to this visit.   Finish azithromycin, prednisone,return if worsening.   Meds ordered this encounter  Medications  . Vitamin D, Ergocalciferol, (DRISDOL) 1.25 MG (50000 UNIT) CAPS capsule    Sig: Take 1 capsule (50,000 Units total) by mouth every 7 (seven) days. (taking one tablet per week)    Dispense:  10 capsule    Refill:  0   Orders Placed This Encounter  Procedures  . VITAMIN D 25 Hydroxy (Vit-D Deficiency, Fractures)    Standing Status:   Future    Standing Expiration Date:   08/29/2020  . HgB A1c  . C-reactive protein  . ANA  . Rheumatoid factor  . CBC with Differential/Platelet  . Comprehensive Metabolic Panel (CMET)  . T4  . T3  . Lyme Ab/Western Blot Reflex  . Ambulatory referral to Cardiology    Referral Priority:   Routine    Referral Type:   Consultation    Referral Reason:   Specialty Services Required    Requested Specialty:   Cardiology    Number of Visits Requested:   1  . POCT urinalysis dipstick  . EKG 12-Lead   Red Flags discussed. The patient was given clear instructions to go to ER or return to medical center if any red flags develop, symptoms do not improve, worsen or new problems develop. They verbalized understanding.  Return in about 3 months (around 07/29/2020), or if symptoms worsen or fail to improve, for at any time for any worsening symptoms, Go to Emergency room/ urgent care if worse.      IBeverely Pace Kyian Obst, FNP, have reviewed all  documentation for this visit. The documentation on 04/30/20 for the exam, diagnosis, procedures, and orders are all accurate and complete.    Jairo Ben, FNP  [facility] [phone] (phone) [phone] (fax)  [facility]

## 2020-04-30 DIAGNOSIS — W57XXXA Bitten or stung by nonvenomous insect and other nonvenomous arthropods, initial encounter: Secondary | ICD-10-CM | POA: Insufficient documentation

## 2020-04-30 DIAGNOSIS — R399 Unspecified symptoms and signs involving the genitourinary system: Secondary | ICD-10-CM | POA: Insufficient documentation

## 2020-04-30 DIAGNOSIS — E559 Vitamin D deficiency, unspecified: Secondary | ICD-10-CM | POA: Insufficient documentation

## 2020-04-30 DIAGNOSIS — M2550 Pain in unspecified joint: Secondary | ICD-10-CM | POA: Insufficient documentation

## 2020-04-30 DIAGNOSIS — R7309 Other abnormal glucose: Secondary | ICD-10-CM | POA: Insufficient documentation

## 2020-04-30 DIAGNOSIS — R002 Palpitations: Secondary | ICD-10-CM | POA: Insufficient documentation

## 2020-04-30 DIAGNOSIS — R5383 Other fatigue: Secondary | ICD-10-CM | POA: Insufficient documentation

## 2020-05-01 LAB — COMPREHENSIVE METABOLIC PANEL
ALT: 13 IU/L (ref 0–32)
AST: 13 IU/L (ref 0–40)
Albumin/Globulin Ratio: 2 (ref 1.2–2.2)
Albumin: 4.8 g/dL (ref 3.8–4.8)
Alkaline Phosphatase: 74 IU/L (ref 48–121)
BUN/Creatinine Ratio: 10 (ref 9–23)
BUN: 9 mg/dL (ref 6–20)
Bilirubin Total: 0.3 mg/dL (ref 0.0–1.2)
CO2: 24 mmol/L (ref 20–29)
Calcium: 10.1 mg/dL (ref 8.7–10.2)
Chloride: 96 mmol/L (ref 96–106)
Creatinine, Ser: 0.87 mg/dL (ref 0.57–1.00)
GFR calc Af Amer: 102 mL/min/{1.73_m2} (ref >59)
GFR calc non Af Amer: 88 mL/min/{1.73_m2} (ref >59)
Globulin, Total: 2.4 g/dL (ref 1.5–4.5)
Glucose: 80 mg/dL (ref 65–99)
Potassium: 4.4 mmol/L (ref 3.5–5.2)
Sodium: 138 mmol/L (ref 134–144)
Total Protein: 7.2 g/dL (ref 6.0–8.5)

## 2020-05-01 LAB — RHEUMATOID FACTOR: Rheumatoid fact SerPl-aCnc: <10 IU/mL (ref 0.0–13.9)

## 2020-05-01 LAB — CBC WITH DIFFERENTIAL/PLATELET
Basophils Absolute: 0.1 10*3/uL (ref 0.0–0.2)
Basos: 1 %
EOS (ABSOLUTE): 0.4 10*3/uL (ref 0.0–0.4)
Eos: 3 %
Hematocrit: 43.2 % (ref 34.0–46.6)
Hemoglobin: 14.5 g/dL (ref 11.1–15.9)
Immature Grans (Abs): 0.1 10*3/uL (ref 0.0–0.1)
Immature Granulocytes: 1 %
Lymphocytes Absolute: 2.4 10*3/uL (ref 0.7–3.1)
Lymphs: 23 %
MCH: 31.7 pg (ref 26.6–33.0)
MCHC: 33.6 g/dL (ref 31.5–35.7)
MCV: 94 fL (ref 79–97)
Monocytes Absolute: 0.6 10*3/uL (ref 0.1–0.9)
Monocytes: 6 %
Neutrophils Absolute: 7.1 10*3/uL — ABNORMAL HIGH (ref 1.4–7.0)
Neutrophils: 66 %
Platelets: 331 10*3/uL (ref 150–450)
RBC: 4.58 x10E6/uL (ref 3.77–5.28)
RDW: 12.2 % (ref 11.7–15.4)
WBC: 10.5 10*3/uL (ref 3.4–10.8)

## 2020-05-01 LAB — T4: T4, Total: 7.1 ug/dL (ref 4.5–12.0)

## 2020-05-01 LAB — ANA: Anti Nuclear Antibody (ANA): NEGATIVE

## 2020-05-01 LAB — HEMOGLOBIN A1C
Est. average glucose Bld gHb Est-mCnc: 103 mg/dL
Hgb A1c MFr Bld: 5.2 % (ref 4.8–5.6)

## 2020-05-01 LAB — T3: T3, Total: 101 ng/dL (ref 71–180)

## 2020-05-01 LAB — C-REACTIVE PROTEIN: CRP: <1 mg/L (ref 0–10)

## 2020-05-03 ENCOUNTER — Ambulatory Visit: Payer: Medicaid Other | Admitting: Cardiology

## 2020-05-03 NOTE — Progress Notes (Signed)
Labs within normal limits. She should finish her antibiotics given prior and follow up if not improving at anytime. Neutrophils were just mildly elevated signifying may have had viral or bacterial infection.

## 2020-05-04 ENCOUNTER — Telehealth: Payer: Self-pay

## 2020-05-04 ENCOUNTER — Encounter: Payer: Self-pay | Admitting: Cardiology

## 2020-05-04 NOTE — Telephone Encounter (Signed)
Written by Berniece Pap, FNP on 05/03/2020 9:31 AM EDT Seen by patient Robin Whitaker on 05/04/2020 1:37 AM

## 2020-05-04 NOTE — Telephone Encounter (Signed)
-----   Message from Berniece Pap, FNP sent at 05/03/2020  9:31 AM EDT ----- Labs within normal limits. She should finish her antibiotics given prior and follow up if not improving at anytime. Neutrophils were just mildly elevated signifying may have had viral or bacterial infection.

## 2020-05-10 NOTE — Progress Notes (Signed)
Whitaker, Robin Fried, FNP   Chief Complaint  Patient presents with  . Pelvic Pain  . Menstrual Problem    HPI:      Robin Whitaker is a 34 y.o. No obstetric history on file. whose LMP was Patient's last menstrual period was 04/17/2020., presents today for NP eval for irregular menses, referred by PCP. Had neg labs 7/21 with PCP (no prolactin done). Menses are Q31-35 days, last 5 days, mod flow with stringy clots, with dysmen, improved with NSAIDs, no BTB, for the past yr. Periods used to be closer to 28-30 days and without clots, and pt has been 2-4 wks late a few times. Pt also has intermittent low back pain that feels "deep", and radiates to lower pelvis and RLQ; thinks it's worse around mid-cylce and before her period. Pain resolves with menses; improved with herbal supp. Pt also is tender to touch RLQ. No urin sx, no vag sx, no GI sx except occas constipation. 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 Also has a hx of kidney stones. No hx of ovar cysts.   Last pap about 1 1/2 yrs ago, No hx of abn paps. Hx of chlamydia years ago. Robin Whitaker is not currently sex active for a few month. No pain/bleeding with sex.   Did OCPs, patch, and nuvaring in past with severe migraines. Prefers not to be on hormones. No hx of HTN, DVTs, migraines with aura.    Past Medical History:  Diagnosis Date  . Allergy   . Anxiety   . Chronic back pain   . Migraine     Past Surgical History:  Procedure Laterality Date  . APPENDECTOMY    . WISDOM TOOTH EXTRACTION      Family History  Problem Relation Age of Onset  . Leukemia Brother   . Arthritis Maternal Grandmother   . Anuerysm Maternal Grandfather   . Congestive Heart Failure Paternal Grandmother     Social History   Socioeconomic History  . Marital status: Single    Spouse name: Not on file  . Number of children: Not on file  . Years of education: Not on file  . Highest education level: Not on file  Occupational History  . Not on file    Tobacco Use  . Smoking status: Current Every Day Smoker    Types: E-cigarettes  . Smokeless tobacco: Current User  Substance and Sexual Activity  . Alcohol use: Not Currently  . Drug use: Never  . Sexual activity: Not Currently    Partners: Male    Birth control/protection: None  Other Topics Concern  . Not on file  Social History Narrative  . Not on file   Social Determinants of Health   Financial Resource Strain:   . Difficulty of Paying Living Expenses:   Food Insecurity:   . Worried About Programme researcher, broadcasting/film/video in the Last Year:   . Barista in the Last Year:   Transportation Needs:   . Freight forwarder (Medical):   Marland Kitchen Lack of Transportation (Non-Medical):   Physical Activity:   . Days of Exercise per Week:   . Minutes of Exercise per Session:   Stress:   . Feeling of Stress :   Social Connections:   . Frequency of Communication with Friends and Family:   . Frequency of Social Gatherings with Friends and Family:   . Attends Religious Services:   . Active Member of Clubs or Organizations:   . Attends  Club or Organization Meetings:   Marland Kitchen Marital Status:   Intimate Partner Violence:   . Fear of Current or Ex-Partner:   . Emotionally Abused:   Marland Kitchen Physically Abused:   . Sexually Abused:     Outpatient Medications Prior to Visit  Medication Sig Dispense Refill  . cyclobenzaprine (FLEXERIL) 10 MG tablet Take 1 tablet (10 mg total) by mouth at bedtime as needed for muscle spasms. 20 tablet 0  . hydrOXYzine (ATARAX/VISTARIL) 10 MG tablet Take 1 tablet (10 mg total) by mouth 3 (three) times daily as needed for anxiety (may cause drowsiness). 30 tablet 0  . SUMAtriptan (IMITREX) 100 MG tablet Take by mouth.    . Vitamin D, Ergocalciferol, (DRISDOL) 1.25 MG (50000 UNIT) CAPS capsule Take 1 capsule (50,000 Units total) by mouth every 7 (seven) days. (taking one tablet per week) 10 capsule 0  . nortriptyline (PAMELOR) 10 MG capsule Take 1 pill at night for one week  then increase to 2 pills at night (Patient not taking: Reported on 05/11/2020)     No facility-administered medications prior to visit.      ROS:  Review of Systems  Constitutional: Negative for fever.  Gastrointestinal: Negative for blood in stool, constipation, diarrhea, nausea and vomiting.  Genitourinary: Positive for menstrual problem and pelvic pain. Negative for dyspareunia, dysuria, flank pain, frequency, hematuria, urgency, vaginal bleeding, vaginal discharge and vaginal pain.  Musculoskeletal: Negative for back pain.  Skin: Negative for rash.  BREAST: No symptoms   OBJECTIVE:   Vitals:  BP 122/74   Ht 5\' 4"  (1.626 m)   Wt 121 lb (54.9 kg)   LMP 04/17/2020   BMI 20.77 kg/m   Physical Exam Vitals reviewed.  Constitutional:      Appearance: Robin Whitaker is well-developed. Robin Whitaker is not ill-appearing or toxic-appearing.  Pulmonary:     Effort: Pulmonary effort is normal.  Abdominal:     Palpations: Abdomen is soft.     Tenderness: There is abdominal tenderness in the right lower quadrant and suprapubic area. There is no right CVA tenderness, left CVA tenderness, guarding or rebound.  Genitourinary:    General: Normal vulva.     Pubic Area: No rash.      Labia:        Right: No rash, tenderness or lesion.        Left: No rash, tenderness or lesion.      Vagina: Normal. No vaginal discharge, erythema or tenderness.     Cervix: Normal.     Uterus: Normal. Not enlarged and not tender.      Adnexa: Left adnexa normal.       Right: Tenderness present. No mass.         Left: No mass or tenderness.    Musculoskeletal:        General: Normal range of motion.     Cervical back: Normal range of motion.  Skin:    General: Skin is warm and dry.  Neurological:     General: No focal deficit present.     Mental Status: Robin Whitaker is alert and oriented to person, place, and time.     Cranial Nerves: No cranial nerve deficit.  Psychiatric:        Mood and Affect: Mood normal.         Behavior: Behavior normal.        Thought Content: Thought content normal.        Judgment: Judgment normal.     Results: Results for orders  placed or performed in visit on 05/11/20 (from the past 24 hour(s))  POCT Urinalysis Dipstick     Status: Normal   Collection Time: 05/11/20  1:45 PM  Result Value Ref Range   Color, UA yellow    Clarity, UA clear    Glucose, UA Negative Negative   Bilirubin, UA neg    Ketones, UA neg    Spec Grav, UA 1.020 1.010 - 1.025   Blood, UA small    pH, UA 6.0 5.0 - 8.0   Protein, UA Negative Negative   Urobilinogen, UA     Nitrite, UA neg    Leukocytes, UA Negative Negative   Appearance     Odor       Assessment/Plan: Pelvic pain - Plan: Urine Culture, US PELVIS TRANSVAGINAL NON-OB (TV ONLY); Tender on exam. Check GYN u/s, rule out STDs. Will f/u with results.   Menstrual irregularity--normal menses by description. Rule out path with GYN u/s. If neg, reassurance. Discussed hormones for cycle control (pt not interested if possible) vs following along since menses actually normal.  Acute midline low back pain without sciatica--Check GYN u/s. If neg, question MSK vs kidney stones. Checking UA/ C&S due to hematuria.   Asymptomatic microscopic hematuria - Plan: Urinalysis, Routine w reflex microscopic, Urine Culture, POCT Urinalysis Dipstick; will f/u with results. May need urology ref.  Screening for STD (sexually transmitted disease) - Plan: Cervicovaginal ancillary only    Return in about 1 day (around 05/12/2020) for GYN u/s for pelvic pain--[facility] to call pt.  Skilar Marcou B. Kevion Fatheree, PA-C 05/11/2020 1:47 PM

## 2020-05-11 ENCOUNTER — Encounter: Payer: Self-pay | Admitting: Obstetrics and Gynecology

## 2020-05-11 ENCOUNTER — Other Ambulatory Visit: Payer: Self-pay

## 2020-05-11 ENCOUNTER — Other Ambulatory Visit (HOSPITAL_COMMUNITY)
Admission: RE | Admit: 2020-05-11 | Discharge: 2020-05-11 | Disposition: A | Discharge status: Home / Self Care | Payer: Medicaid Other | Source: Ambulatory Visit | Attending: Obstetrics and Gynecology | Admitting: Obstetrics and Gynecology

## 2020-05-11 ENCOUNTER — Ambulatory Visit (INDEPENDENT_AMBULATORY_CARE_PROVIDER_SITE_OTHER): Payer: Medicaid Other | Admitting: Obstetrics and Gynecology

## 2020-05-11 VITALS — BP 122/74 | Ht 64.0 in | Wt 121.0 lb

## 2020-05-11 DIAGNOSIS — M545 Low back pain, unspecified: Secondary | ICD-10-CM

## 2020-05-11 DIAGNOSIS — R3121 Asymptomatic microscopic hematuria: Secondary | ICD-10-CM | POA: Diagnosis not present

## 2020-05-11 DIAGNOSIS — N926 Irregular menstruation, unspecified: Secondary | ICD-10-CM

## 2020-05-11 DIAGNOSIS — Z113 Encounter for screening for infections with a predominantly sexual mode of transmission: Secondary | ICD-10-CM

## 2020-05-11 DIAGNOSIS — R102 Pelvic and perineal pain unspecified side: Secondary | ICD-10-CM

## 2020-05-11 LAB — POCT URINALYSIS DIPSTICK
Bilirubin, UA: NEGATIVE
Glucose, UA: NEGATIVE
Ketones, UA: NEGATIVE
Leukocytes, UA: NEGATIVE
Nitrite, UA: NEGATIVE
Protein, UA: NEGATIVE
Spec Grav, UA: 1.02 (ref 1.010–1.025)
pH, UA: 6 (ref 5.0–8.0)

## 2020-05-11 NOTE — Patient Instructions (Signed)
I value your feedback and entrusting us with your care. If you get a [facility] patient survey, I would appreciate you taking the time to let us know about your experience today. Thank you!  As of September 11, 2019, your lab results will be released to your MyChart immediately, before I even have a chance to see them. Please give me time to review them and contact you if there are any abnormalities. Thank you for your patience.  

## 2020-05-12 ENCOUNTER — Encounter: Payer: Self-pay | Admitting: Obstetrics and Gynecology

## 2020-05-12 ENCOUNTER — Ambulatory Visit (INDEPENDENT_AMBULATORY_CARE_PROVIDER_SITE_OTHER): Payer: Medicaid Other

## 2020-05-12 DIAGNOSIS — R102 Pelvic and perineal pain: Secondary | ICD-10-CM | POA: Diagnosis not present

## 2020-05-12 LAB — CERVICOVAGINAL ANCILLARY ONLY
Chlamydia: NEGATIVE
Comment: NEGATIVE
Comment: NORMAL
Neisseria Gonorrhea: NEGATIVE

## 2020-05-12 LAB — URINALYSIS, ROUTINE W REFLEX MICROSCOPIC
Bilirubin, UA: NEGATIVE
Glucose, UA: NEGATIVE
Ketones, UA: NEGATIVE
Leukocytes,UA: NEGATIVE
Nitrite, UA: NEGATIVE
Protein,UA: NEGATIVE
RBC, UA: NEGATIVE
Specific Gravity, UA: 1.021 (ref 1.005–1.030)
Urobilinogen, Ur: 0.2 mg/dL (ref 0.2–1.0)
pH, UA: 5.5 (ref 5.0–7.5)

## 2020-05-12 NOTE — Progress Notes (Signed)
U/S results emailed to pt in MyChart

## 2020-05-13 ENCOUNTER — Ambulatory Visit: Payer: Medicaid Other | Admitting: Adult Health

## 2020-05-13 ENCOUNTER — Ambulatory Visit: Payer: Medicaid Other | Admitting: Physician Assistant

## 2020-05-13 LAB — URINE CULTURE

## 2020-05-13 NOTE — Progress Notes (Deleted)
     Established patient visit   Patient: Robin Whitaker   DOB: 1986/01/12   34 y.o. Female  MRN: [id number] Visit Date: 05/13/2020  Today's healthcare provider: Trey Sailors, PA-C   No chief complaint on file.  Subjective    HPI  Follow up for ***  The patient was last seen for this {NUMBERS 1-12:18279} {days/wks/mos/yrs:310907} ago. Changes made at last visit include ***.  She reports {excellent/good/fair/poor:19665} compliance with treatment. She feels that condition is {improved/worse/unchanged:3041574}. She {is/is not:21021397} having side effects. ***     {Show patient history (optional):23778::" "}   Medications: Outpatient Medications Prior to Visit  Medication Sig  . cyclobenzaprine (FLEXERIL) 10 MG tablet Take 1 tablet (10 mg total) by mouth at bedtime as needed for muscle spasms.  . hydrOXYzine (ATARAX/VISTARIL) 10 MG tablet Take 1 tablet (10 mg total) by mouth 3 (three) times daily as needed for anxiety (may cause drowsiness).  . nortriptyline (PAMELOR) 10 MG capsule Take 1 pill at night for one week then increase to 2 pills at night (Patient not taking: Reported on 05/11/2020)  . SUMAtriptan (IMITREX) 100 MG tablet Take by mouth.  . Vitamin D, Ergocalciferol, (DRISDOL) 1.25 MG (50000 UNIT) CAPS capsule Take 1 capsule (50,000 Units total) by mouth every 7 (seven) days. (taking one tablet per week)   No facility-administered medications prior to visit.    Review of Systems  {Heme  Chem  Endocrine  Serology  Results Review (optional):23779::" "}  Objective    LMP 04/17/2020  {Show previous vital signs (optional):23777::" "}  Physical Exam  ***  No results found for any visits on 05/13/20.  Assessment & Plan     ***  No follow-ups on file.      {provider attestation***:1}   Maryella Shivers  [facility] [phone] (phone) [phone] (fax)  [facility]

## 2020-05-26 ENCOUNTER — Ambulatory Visit: Payer: Medicaid Other | Admitting: Physician Assistant

## 2020-05-26 ENCOUNTER — Ambulatory Visit: Payer: Medicaid Other | Admitting: Adult Health

## 2020-05-27 NOTE — Progress Notes (Deleted)
     Established patient visit   Patient: Magdaline Zollars   DOB: [birth year]   34 y.o. Female  MRN: [id number] Visit Date: 05/28/2020  Today's healthcare provider: Margaretann Loveless, PA-C   No chief complaint on file.  Subjective    HPI  Follow up for ***  The patient was last seen for this {NUMBERS 1-12:18279} {days/wks/mos/yrs:310907} ago. Changes made at last visit include ***.  She reports {excellent/good/fair/poor:19665} compliance with treatment. She feels that condition is {improved/worse/unchanged:3041574}. She {is/is not:21021397} having side effects. ***  -----------------------------------------------------------------------------------------    {Show patient history (optional):23778::" "}   Medications: Outpatient Medications Prior to Visit  Medication Sig  . cyclobenzaprine (FLEXERIL) 10 MG tablet Take 1 tablet (10 mg total) by mouth at bedtime as needed for muscle spasms.  . hydrOXYzine (ATARAX/VISTARIL) 10 MG tablet Take 1 tablet (10 mg total) by mouth 3 (three) times daily as needed for anxiety (may cause drowsiness).  . nortriptyline (PAMELOR) 10 MG capsule Take 1 pill at night for one week then increase to 2 pills at night (Patient not taking: Reported on 05/11/2020)  . SUMAtriptan (IMITREX) 100 MG tablet Take by mouth.  . Vitamin D, Ergocalciferol, (DRISDOL) 1.25 MG (50000 UNIT) CAPS capsule Take 1 capsule (50,000 Units total) by mouth every 7 (seven) days. (taking one tablet per week)   No facility-administered medications prior to visit.    Review of Systems  {Heme  Chem  Endocrine  Serology  Results Review (optional):23779::" "}  Objective    There were no vitals taken for this visit. {Show previous vital signs (optional):23777::" "}  Physical Exam  ***  No results found for any visits on 05/28/20.  Assessment & Plan     ***  No follow-ups on file.      {provider attestation***:1}   Reine Just  [facility] [phone] (phone) [phone] (fax)  [facility]

## 2020-05-28 ENCOUNTER — Ambulatory Visit: Payer: Medicaid Other | Admitting: Physician Assistant

## 2020-06-13 ENCOUNTER — Other Ambulatory Visit: Payer: Self-pay | Admitting: Adult Health

## 2020-06-13 NOTE — Telephone Encounter (Signed)
Requested Prescriptions  Pending Prescriptions Disp Refills  . hydrOXYzine (ATARAX/VISTARIL) 10 MG tablet [Pharmacy Med Name: hydrOXYzine HCl 10 MG Oral Tablet] 30 tablet 0    Sig: TAKE 1 TABLET BY MOUTH THREE TIMES DAILY AS NEEDED FOR ANXIETY MAY CAUSE DROWSINESS     Ear, Nose, and Throat:  Antihistamines Passed - 06/13/2020  3:59 PM      Passed - Valid encounter within last 12 months    Recent Outpatient Visits          1 month ago Anxiety   [facility] Flinchum, Eula Fried, FNP   1 month ago Neck pain   [facility] Flinchum, Eula Fried, FNP   2 months ago Chronic neck pain   [facility] Flinchum, Eula Fried, FNP

## 2020-10-06 ENCOUNTER — Other Ambulatory Visit: Payer: Self-pay | Admitting: Adult Health

## 2021-01-04 ENCOUNTER — Ambulatory Visit: Payer: Medicaid Other | Admitting: Adult Health

## 2021-01-12 ENCOUNTER — Other Ambulatory Visit: Payer: Self-pay

## 2021-01-12 ENCOUNTER — Emergency Department
Admission: EM | Admit: 2021-01-12 | Discharge: 2021-01-12 | Disposition: A | Discharge status: Home / Self Care | Payer: Medicaid Other | Attending: Emergency Medicine | Admitting: Emergency Medicine

## 2021-01-12 ENCOUNTER — Encounter: Payer: Self-pay | Admitting: Emergency Medicine

## 2021-01-12 ENCOUNTER — Emergency Department: Payer: Medicaid Other

## 2021-01-12 DIAGNOSIS — R208 Other disturbances of skin sensation: Secondary | ICD-10-CM | POA: Diagnosis not present

## 2021-01-12 DIAGNOSIS — M545 Low back pain, unspecified: Secondary | ICD-10-CM | POA: Diagnosis not present

## 2021-01-12 DIAGNOSIS — Z87442 Personal history of urinary calculi: Secondary | ICD-10-CM | POA: Diagnosis not present

## 2021-01-12 DIAGNOSIS — R109 Unspecified abdominal pain: Secondary | ICD-10-CM

## 2021-01-12 DIAGNOSIS — G8929 Other chronic pain: Secondary | ICD-10-CM | POA: Insufficient documentation

## 2021-01-12 DIAGNOSIS — Z5321 Procedure and treatment not carried out due to patient leaving prior to being seen by health care provider: Secondary | ICD-10-CM | POA: Diagnosis not present

## 2021-01-12 LAB — URINALYSIS, COMPLETE (UACMP) WITH MICROSCOPIC
Bacteria, UA: NONE SEEN
Bilirubin Urine: NEGATIVE
Glucose, UA: NEGATIVE mg/dL
Ketones, ur: NEGATIVE mg/dL
Leukocytes,Ua: NEGATIVE
Nitrite: NEGATIVE
Protein, ur: NEGATIVE mg/dL
Specific Gravity, Urine: 1.008 (ref 1.005–1.030)
pH: 6 (ref 5.0–8.0)

## 2021-01-12 LAB — CBC
HCT: 41.9 % (ref 36.0–46.0)
Hemoglobin: 14.1 g/dL (ref 12.0–15.0)
MCH: 31.4 pg (ref 26.0–34.0)
MCHC: 33.7 g/dL (ref 30.0–36.0)
MCV: 93.3 fL (ref 80.0–100.0)
Platelets: 210 10*3/uL (ref 150–400)
RBC: 4.49 MIL/uL (ref 3.87–5.11)
RDW: 13 % (ref 11.5–15.5)
WBC: 9.5 10*3/uL (ref 4.0–10.5)
nRBC: 0 % (ref 0.0–0.2)

## 2021-01-12 LAB — URINE DRUG SCREEN, QUALITATIVE (ARMC ONLY)
Amphetamines, Ur Screen: NOT DETECTED
Barbiturates, Ur Screen: NOT DETECTED
Benzodiazepine, Ur Scrn: POSITIVE — AB
Cannabinoid 50 Ng, Ur ~~LOC~~: POSITIVE — AB
Cocaine Metabolite,Ur ~~LOC~~: NOT DETECTED
MDMA (Ecstasy)Ur Screen: NOT DETECTED
Methadone Scn, Ur: NOT DETECTED
Opiate, Ur Screen: NOT DETECTED
Phencyclidine (PCP) Ur S: NOT DETECTED
Tricyclic, Ur Screen: NOT DETECTED

## 2021-01-12 LAB — BASIC METABOLIC PANEL
Anion gap: 8 (ref 5–15)
BUN: 17 mg/dL (ref 6–20)
CO2: 25 mmol/L (ref 22–32)
Calcium: 9.3 mg/dL (ref 8.9–10.3)
Chloride: 102 mmol/L (ref 98–111)
Creatinine, Ser: 0.99 mg/dL (ref 0.44–1.00)
GFR, Estimated: >60 mL/min (ref >60)
Glucose, Bld: 87 mg/dL (ref 70–99)
Potassium: 4.1 mmol/L (ref 3.5–5.1)
Sodium: 135 mmol/L (ref 135–145)

## 2021-01-12 LAB — PREGNANCY, URINE: Preg Test, Ur: NEGATIVE

## 2021-01-12 MED ORDER — ONDANSETRON 4 MG PO TBDP: 4.0000 mg | ORAL_TABLET | Freq: Three times a day (TID) | ORAL | 0 refills | Status: DC | PRN

## 2021-01-12 MED ORDER — KETOROLAC TROMETHAMINE 30 MG/ML IJ SOLN
15.0000 mg | Freq: Once | INTRAMUSCULAR | Status: AC
  Administered 2021-01-12: 15 mg via INTRAMUSCULAR
  Filled 2021-01-12: qty 1

## 2021-01-12 MED ORDER — KETOROLAC TROMETHAMINE 10 MG PO TABS: 10.0000 mg | ORAL_TABLET | Freq: Three times a day (TID) | ORAL | 0 refills | Status: DC

## 2021-01-12 MED ORDER — DICYCLOMINE HCL 10 MG PO CAPS: 10.0000 mg | ORAL_CAPSULE | Freq: Four times a day (QID) | ORAL | 0 refills | Status: DC

## 2021-01-12 NOTE — Discharge Instructions (Addendum)
Your exam and labs overall benign reassuring.  CT scan not reveal any obstructive kidney stones or any serious abdominal process.  Take prescription medications as provided.  You should follow-up with your primary provider for ongoing symptom management of other complaints.  Return to the ED if needed.

## 2021-01-12 NOTE — ED Triage Notes (Signed)
Pt comes into the ED via POV c/o skin pain and lower back pain.  Pt states this has been ongoing for weeks.  Pt states she is having chronic pain and it has progressed.  She states she has also lost 10 lbs in 1 month.  Pt explains it is a full body pain.  Pt unable to f/u with her PCP at this point so far.  Pt also states she has a h/o kidney stones.

## 2021-01-12 NOTE — ED Provider Notes (Signed)
[facility] Emergency Department Provider Note ____________________________________________  Time seen: 1545  I have reviewed the triage vital signs and the nursing notes.  HISTORY  Chief Complaint  Back Pain, Skin Problem, and Multiple complaints  HPI Robin Whitaker is a 35 y.o. female presents herself to the ED with complaints of "skin pain" and low back pain.  Patient describes onset several weeks ago.  She describes progression of her chronic back pain.  Patient also reports a 10 pound weight loss over the last month that has been unplanned.  She denies any fever, chills, sweats, chest pain, shortness of breath, or weakness.   She denies any nausea, vomiting, diarrhea, or constipation.  She also denies any rashes or other skin changes.  Past Medical History:  Diagnosis Date  . Allergy   . Anxiety   . Chronic back pain   . Migraine     Patient Active Problem List   Diagnosis Date Noted  . Vitamin D insufficiency 04/30/2020  . Arthralgia 04/30/2020  . Fatigue 04/30/2020  . Elevated glucose 04/30/2020  . Tick bite 04/30/2020  . Urinary symptom or sign 04/30/2020  . Palpitations 04/30/2020  . Headache disorder 04/14/2020  . Chronic neck pain 04/09/2020  . Screening for blood or protein in urine 04/09/2020  . History of scoliosis 04/09/2020  . Scoliosis 04/09/2020  . Acute midline low back pain with bilateral sciatica 04/09/2020  . Anxiety 04/09/2020  . Migraine without status migrainosus, not intractable 04/08/2020    Past Surgical History:  Procedure Laterality Date  . APPENDECTOMY    . WISDOM TOOTH EXTRACTION      Prior to Admission medications   Medication Sig Start Date End Date Taking? Authorizing Provider  dicyclomine (BENTYL) 10 MG capsule Take 1 capsule (10 mg total) by mouth 4 (four) times daily for 10 days. 01/12/21 01/22/21 Yes Shloime Keilman, Charlesetta Ivory, PA-C  ketorolac (TORADOL) 10 MG tablet Take 1 tablet (10 mg total) by mouth every 8  (eight) hours. 01/12/21  Yes Azari Hasler, Charlesetta Ivory, PA-C  ondansetron (ZOFRAN ODT) 4 MG disintegrating tablet Take 1 tablet (4 mg total) by mouth every 8 (eight) hours as needed. 01/12/21  Yes Demetrica Zipp, Charlesetta Ivory, PA-C  cyclobenzaprine (FLEXERIL) 10 MG tablet Take 1 tablet (10 mg total) by mouth at bedtime as needed for muscle spasms. 04/12/20   Flinchum, Eula Fried, FNP  hydrOXYzine (ATARAX/VISTARIL) 10 MG tablet TAKE 1 TABLET BY MOUTH THREE TIMES DAILY AS NEEDED FOR ANXIETY MAY CAUSE DROWSINESS 10/06/20   Flinchum, Eula Fried, FNP  nortriptyline (PAMELOR) 10 MG capsule Take 1 pill at night for one week then increase to 2 pills at night Patient not taking: Reported on 05/11/2020 04/14/20   [provider]  SUMAtriptan (IMITREX) 100 MG tablet Take by mouth.    [provider]  Vitamin D, Ergocalciferol, (DRISDOL) 1.25 MG (50000 UNIT) CAPS capsule Take 1 capsule (50,000 Units total) by mouth every 7 (seven) days. (taking one tablet per week) 04/28/20   Flinchum, Eula Fried, FNP    Allergies Other  Family History  Problem Relation Age of Onset  . Leukemia Brother   . Arthritis Maternal Grandmother   . Anuerysm Maternal Grandfather   . Congestive Heart Failure Paternal Grandmother     Social History Social History   Tobacco Use  . Smoking status: Current Every Day Smoker    Types: E-cigarettes  . Smokeless tobacco: Current User  Substance Use Topics  . Alcohol use: Not Currently  .  Drug use: Never    Review of Systems  Constitutional: Negative for fever. Eyes: Negative for visual changes. ENT: Negative for sore throat. Cardiovascular: Negative for chest pain. Respiratory: Negative for shortness of breath. Gastrointestinal: Negative for abdominal pain, vomiting and diarrhea. Genitourinary: Negative for dysuria. Musculoskeletal: Positive for back pain. Skin: Negative for rash. Reports skin pain as above Neurological: Negative for headaches, focal weakness or  numbness. ____________________________________________  PHYSICAL EXAM:  VITAL SIGNS: ED Triage Vitals  Enc Vitals Group     BP 01/12/21 1449 128/83     Pulse Rate 01/12/21 1449 95     Resp 01/12/21 1449 17     Temp 01/12/21 1449 99.1 F (37.3 C)     Temp Source 01/12/21 1449 Oral     SpO2 01/12/21 1449 100 %     Weight 01/12/21 1445 110 lb 1.6 oz (49.9 kg)     Height 01/12/21 1445 5\' 4"  (1.626 m)     Head Circumference --      Peak Flow --      Pain Score 01/12/21 1445 7     Pain Loc --      Pain Edu? --      Excl. in GC? --     Constitutional: Alert and oriented. Well appearing and in no distress. Head: Normocephalic and atraumatic. Eyes: Conjunctivae are normal. PERRL. Pupils are pinpoint. Normal extraocular movements Ears: Canals clear. TMs intact bilaterally. Nose: No congestion/rhinorrhea/epistaxis. Mouth/Throat: Mucous membranes are moist. Neck: Supple. No thyromegaly.  No midline tenderness or nuchal rigidity appreciated. Hematological/Lymphatic/Immunological: No cervical lymphadenopathy. Cardiovascular: Normal rate, regular rhythm. Normal distal pulses. Respiratory: Normal respiratory effort. No wheezes/rales/rhonchi. Gastrointestinal: Soft and nontender. No distention, rebound, guarding, or rigidity.  Normoactive bowel sounds appreciated.  Patient with some mild bilateral flank tenderness. Musculoskeletal: Normal spinal alignment without midline tenderness, spasm, deformity, or step-off.  Nontender with normal range of motion in all extremities.  Neurologic:  Normal gait without ataxia. Normal speech and language. No gross focal neurologic deficits are appreciated. Skin:  Skin is warm, dry and intact. No rash noted. Psychiatric: Mood is anxious and affect is worried. Patient exhibits appropriate insight and judgment. ____________________________________________   LABS (pertinent positives/negatives)  Labs Reviewed  URINALYSIS, COMPLETE (UACMP) WITH MICROSCOPIC -  Abnormal; Notable for the following components:      Result Value   Color, Urine YELLOW (*)    APPearance HAZY (*)    Hgb urine dipstick MODERATE (*)    All other components within normal limits  URINE DRUG SCREEN, QUALITATIVE ([facility] ONLY) - Abnormal; Notable for the following components:   Cannabinoid 50 Ng, Ur [place] POSITIVE (*)    Benzodiazepine, Ur Scrn POSITIVE (*)    All other components within normal limits  BASIC METABOLIC PANEL  CBC  PREGNANCY, URINE  ____________________________________________   RADIOLOGY  CT Renal Stone Study  IMPRESSION: 1. Punctate nonobstructive left nephrolithiasis. 2. Otherwise no acute intra-abdominal or intrapelvic abnormality on this noncontrast study. ____________________________________________  PROCEDURES  Toradol 15 mg IM  Procedures ____________________________________________  INITIAL IMPRESSION / ASSESSMENT AND PLAN / ED COURSE  Patient ED evaluation bilateral low back pain that has been persistent for several weeks.  Patient would describe acute flare of her chronic low back pain.  She also has other complaints including some skin pain without signs of rash or irritation; and some generalized abdominal pain with nausea..  She was evaluated for symptoms and found to have normal lab as well as negative CT renal stone study.  Patient is discharged with prescriptions for ketorolac, Zofran, and dicyclomine.  Patient is encouraged to follow-up with primary provider for ongoing symptom management.  Return precautions have been discussed.   Robin Whitaker was evaluated in Emergency Department on 01/15/2021 for the symptoms described in the history of present illness. She was evaluated in the context of the global COVID-19 pandemic, which necessitated consideration that the patient might be at risk for infection with the SARS-CoV-2 virus that causes COVID-19. Institutional protocols and algorithms that pertain to the evaluation of patients at risk  for COVID-19 are in a state of rapid change based on information released by regulatory bodies including the CDC and federal and state organizations. These policies and algorithms were followed during the patient's care in the ED. ____________________________________________  FINAL CLINICAL IMPRESSION(S) / ED DIAGNOSES  Final diagnoses:  Flank pain      Hadja Harral, Charlesetta Ivory, PA-C 01/15/21 1718    Sharyn Creamer, MD 01/25/21 [phone]

## 2021-02-11 ENCOUNTER — Ambulatory Visit (INDEPENDENT_AMBULATORY_CARE_PROVIDER_SITE_OTHER): Payer: Medicaid Other | Admitting: Family Medicine

## 2021-02-11 ENCOUNTER — Other Ambulatory Visit: Payer: Self-pay

## 2021-02-11 VITALS — BP 119/85 | HR 77 | Ht 65.0 in | Wt 113.0 lb

## 2021-02-11 DIAGNOSIS — G43909 Migraine, unspecified, not intractable, without status migrainosus: Secondary | ICD-10-CM | POA: Diagnosis not present

## 2021-02-11 DIAGNOSIS — M542 Cervicalgia: Secondary | ICD-10-CM | POA: Diagnosis not present

## 2021-02-11 DIAGNOSIS — F419 Anxiety disorder, unspecified: Secondary | ICD-10-CM

## 2021-02-11 DIAGNOSIS — G8929 Other chronic pain: Secondary | ICD-10-CM

## 2021-02-11 MED ORDER — SUMATRIPTAN SUCCINATE 100 MG PO TABS: 100.0000 mg | ORAL_TABLET | ORAL | 3 refills | Status: DC | PRN

## 2021-02-11 MED ORDER — GABAPENTIN 400 MG PO CAPS: 400.0000 mg | ORAL_CAPSULE | Freq: Two times a day (BID) | ORAL | 3 refills | Status: DC

## 2021-02-11 NOTE — Patient Instructions (Signed)
.   Marvell Fuller, FNP is now seeing patients at [facility] at [address] in [place]. You can schedule appointments with Ms. Flinchum through MyChart or by calling [phone]

## 2021-02-11 NOTE — Progress Notes (Signed)
Established patient visit   Patient: Robin Whitaker   DOB: 1986/07/18   35 y.o. Female  MRN: [id number] Visit Date: 02/11/2021  Today's healthcare provider: Mila Merry, MD   Chief Complaint  Patient presents with  . Anxiety  . Arm Pain   Subjective    HPI  Anxiety, Follow-up  She was last seen for anxiety 10 months ago. Changes made at last visit include none.    She reports n/a compliance with treatment. She reports n/a tolerance of treatment. She is not having side effects.    She feels her anxiety is moderate and Worse since last visit.  Symptoms: Yes chest pain Yes difficulty concentrating  Yes dizziness Yes fatigue  Yes feelings of losing control No insomnia  Yes irritable No palpitations  Yes panic attacks Yes racing thoughts  Yes shortness of breath No sweating  Yes tremors/shakes    GAD-7 Results GAD-7 Generalized Anxiety Disorder Screening Tool 02/11/2021 04/08/2020  1. Feeling Nervous, Anxious, or on Edge 2 3  2. Not Being Able to Stop or Control Worrying 2 2  3. Worrying Too Much About Different Things 2 2  4. Trouble Relaxing 3 3  5. Being So Restless it's Hard To Sit Still 3 1  6. Becoming Easily Annoyed or Irritable 2 3  7. Feeling Afraid As If Something Awful Might Happen 1 0  Total GAD-7 Score 15 14  Difficulty At Work, Home, or Getting  Along With Others? Extremely difficult Very difficult    PHQ-9 Scores PHQ9 SCORE ONLY 02/11/2021 04/08/2020  PHQ-9 Total Score 8 5    --------------------------------------------------------------------------------------------------- She has also had migraine headaches for years, which have been much more frequent since moving from [place]. Was previously taking 800mg  gabapentin twice a day for chronic pain and headaches and reports her migraines were much less frequent then. She is being followed at [facility] for neck, back and shoulder pain where she was prescribed 300mg  BID, but she states that would  not prescribe a higher dose.   Patient needs refill on Imitrex.       Medications: Outpatient Medications Prior to Visit  Medication Sig  . cyclobenzaprine (FLEXERIL) 10 MG tablet Take 1 tablet (10 mg total) by mouth at bedtime as needed for muscle spasms.  [facility] gabapentin (NEURONTIN) 300 MG capsule Take 1 capsule (300 mg total) by mouth 2 (two) times daily.  . hydrOXYzine (ATARAX/VISTARIL) 10 MG tablet TAKE 1 TABLET BY MOUTH THREE TIMES DAILY AS NEEDED FOR ANXIETY MAY CAUSE DROWSINESS  . ketorolac (TORADOL) 10 MG tablet Take 1 tablet (10 mg total) by mouth every 8 (eight) hours.  . ondansetron (ZOFRAN ODT) 4 MG disintegrating tablet Take 1 tablet (4 mg total) by mouth every 8 (eight) hours as needed.  . SUMAtriptan (IMITREX) 100 MG tablet Take by mouth.  . Vitamin D, Ergocalciferol, (DRISDOL) 1.25 MG (50000 UNIT) CAPS capsule Take 1 capsule (50,000 Units total) by mouth every 7 (seven) days. (taking one tablet per week)  . [DISCONTINUED] nortriptyline (PAMELOR) 10 MG capsule Take 1 pill at night for one week then increase to 2 pills at night  . dicyclomine (BENTYL) 10 MG capsule Take 1 capsule (10 mg total) by mouth 4 (four) times daily for 10 days.   No facility-administered medications prior to visit.         Objective    BP 119/85 (BP Location: Left Arm, Patient Position: Sitting, Cuff Size: Normal)   Pulse 77   Ht 5\' 5"  (  1.651 m)   Wt 113 lb (51.3 kg)   LMP  (LMP Unknown) Comment: neg preg test 01/12/21  SpO2 100%   BMI 18.80 kg/m     Physical Exam  Tiny intradermal lesion right dorsal wrist. A few slightly large subcutaneous nodules which seem to move with underlying veins in right upper arm.     Assessment & Plan       1. Migraine without status migrainosus, not intractable, unspecified migraine type She was on higher dose of gabapentin prior to moving to [place]. Will increase from 300 BID to - gabapentin (NEURONTIN) 400 MG capsule; Take 1-2 capsules (400-800 mg total) by  mouth 2 (two) times daily.  Dispense: 360 capsule; Refill: 3 Refill- SUMAtriptan (IMITREX) 100 MG tablet; Take 1 tablet (100 mg total) by mouth every 2 (two) hours as needed for migraine. No more than 2 in a day  Dispense: 8 tablet; Refill: 3  2. Chronic neck pain  - gabapentin (NEURONTIN) 400 MG capsule; Take 1-2 capsules (400-800 mg total) by mouth 2 (two) times daily.  Dispense: 360 capsule; Refill: 3  3. Anxiety She feels like this was under better control when on higher dose of gabapentin in the past, although she does have more stress to deal with the last several months.  - gabapentin (NEURONTIN) 400 MG capsule; Take 1-2 capsules (400-800 mg total) by mouth 2 (two) times daily.  Dispense: 360 capsule; Refill: 3 - Ambulatory referral to Psychology      The entirety of the information documented in the History of Present Illness, Review of Systems and Physical Exam were personally obtained by me. Portions of this information were initially documented by the CMA and reviewed by me for thoroughness and accuracy.      Mila Merry, MD  [facility] [phone] (phone) [phone] (fax)  [facility]

## 2021-05-02 ENCOUNTER — Ambulatory Visit: Payer: Medicaid Other | Admitting: Family Medicine

## 2021-05-02 NOTE — Progress Notes (Deleted)
Acute Office Visit  Subjective:    Patient ID: Robin Whitaker, female    DOB: 1986-09-15, 35 y.o.   MRN: [id number]  No chief complaint on file.   HPI Patient is in today for right side pain  Past Medical History:  Diagnosis Date   Allergy    Anxiety    Chronic back pain    Migraine     Past Surgical History:  Procedure Laterality Date   APPENDECTOMY     WISDOM TOOTH EXTRACTION      Family History  Problem Relation Age of Onset   Leukemia Brother    Arthritis Maternal Grandmother    Anuerysm Maternal Grandfather    Congestive Heart Failure Paternal Grandmother     Social History   Socioeconomic History   Marital status: Single    Spouse name: Not on file   Number of children: Not on file   Years of education: Not on file   Highest education level: Not on file  Occupational History   Not on file  Tobacco Use   Smoking status: Every Day    Types: E-cigarettes   Smokeless tobacco: Current  Substance and Sexual Activity   Alcohol use: Not Currently   Drug use: Never   Sexual activity: Not Currently    Partners: Male    Birth control/protection: None  Other Topics Concern   Not on file  Social History Narrative   Not on file   Social Determinants of Health   Financial Resource Strain: Not on file  Food Insecurity: Not on file  Transportation Needs: Not on file  Physical Activity: Not on file  Stress: Not on file  Social Connections: Not on file  Intimate Partner Violence: Not on file    Outpatient Medications Prior to Visit  Medication Sig Dispense Refill   cyclobenzaprine (FLEXERIL) 10 MG tablet Take 1 tablet (10 mg total) by mouth at bedtime as needed for muscle spasms. 20 tablet 0   dicyclomine (BENTYL) 10 MG capsule Take 1 capsule (10 mg total) by mouth 4 (four) times daily for 10 days. 40 capsule 0   gabapentin (NEURONTIN) 400 MG capsule Take 1-2 capsules (400-800 mg total) by mouth 2 (two) times daily. 360 capsule 3   hydrOXYzine  (ATARAX/VISTARIL) 10 MG tablet TAKE 1 TABLET BY MOUTH THREE TIMES DAILY AS NEEDED FOR ANXIETY MAY CAUSE DROWSINESS 15 tablet 0   ketorolac (TORADOL) 10 MG tablet Take 1 tablet (10 mg total) by mouth every 8 (eight) hours. 15 tablet 0   ondansetron (ZOFRAN ODT) 4 MG disintegrating tablet Take 1 tablet (4 mg total) by mouth every 8 (eight) hours as needed. 15 tablet 0   SUMAtriptan (IMITREX) 100 MG tablet Take 1 tablet (100 mg total) by mouth every 2 (two) hours as needed for migraine. No more than 2 in a day 8 tablet 3   Vitamin D, Ergocalciferol, (DRISDOL) 1.25 MG (50000 UNIT) CAPS capsule Take 1 capsule (50,000 Units total) by mouth every 7 (seven) days. (taking one tablet per week) 10 capsule 0   No facility-administered medications prior to visit.    Allergies  Allergen Reactions   Other     Trees, grass,dander    Review of Systems     Objective:    Physical Exam  There were no vitals taken for this visit. Wt Readings from Last 3 Encounters:  02/11/21 113 lb (51.3 kg)  01/12/21 110 lb 1.6 oz (49.9 kg)  05/11/20 121 lb (54.9 kg)  Health Maintenance Due  Topic Date Due   COVID-19 Vaccine (1) Never done   Pneumococcal Vaccine 1-25 Years old (1 - PCV) Never done   HIV Screening  Never done   Hepatitis C Screening  Never done   TETANUS/TDAP  Never done   PAP SMEAR-Modifier  Never done   INFLUENZA VACCINE  05/02/2021    There are no preventive care reminders to display for this patient.   Lab Results  Component Value Date   TSH 0.728 04/19/2020   Lab Results  Component Value Date   WBC 9.5 01/12/2021   HGB 14.1 01/12/2021   HCT 41.9 01/12/2021   MCV 93.3 01/12/2021   PLT 210 01/12/2021   Lab Results  Component Value Date   NA 135 01/12/2021   K 4.1 01/12/2021   CO2 25 01/12/2021   GLUCOSE 87 01/12/2021   BUN 17 01/12/2021   CREATININE 0.99 01/12/2021   BILITOT 0.3 04/30/2020   ALKPHOS 74 04/30/2020   AST 13 04/30/2020   ALT 13 04/30/2020   PROT 7.2  04/30/2020   ALBUMIN 4.8 04/30/2020   CALCIUM 9.3 01/12/2021   ANIONGAP 8 01/12/2021   Lab Results  Component Value Date   CHOL 196 04/19/2020   Lab Results  Component Value Date   HDL 56 04/19/2020   Lab Results  Component Value Date   LDLCALC 113 (H) 04/19/2020   Lab Results  Component Value Date   TRIG 154 (H) 04/19/2020   Lab Results  Component Value Date   CHOLHDL 3.5 04/19/2020   Lab Results  Component Value Date   HGBA1C 5.2 04/30/2020       Assessment & Plan:   Problem List Items Addressed This Visit   None    No orders of the defined types were placed in this encounter.    Adline Peals, CMA

## 2021-05-12 ENCOUNTER — Ambulatory Visit (INDEPENDENT_AMBULATORY_CARE_PROVIDER_SITE_OTHER): Payer: Self-pay | Admitting: Licensed Clinical Social Worker

## 2021-05-12 DIAGNOSIS — F489 Nonpsychotic mental disorder, unspecified: Secondary | ICD-10-CM

## 2021-05-12 NOTE — Progress Notes (Signed)
Therapist contacted patient by text and she did not respond. Assessment is a no show

## 2021-06-21 ENCOUNTER — Ambulatory Visit: Payer: Medicaid Other | Admitting: Family Medicine

## 2021-06-22 ENCOUNTER — Ambulatory Visit: Payer: Medicaid Other | Admitting: Family Medicine

## 2021-06-22 ENCOUNTER — Other Ambulatory Visit (HOSPITAL_COMMUNITY)
Admission: RE | Admit: 2021-06-22 | Discharge: 2021-06-22 | Disposition: A | Discharge status: Home / Self Care | Payer: Medicaid Other | Source: Ambulatory Visit | Attending: Family Medicine | Admitting: Family Medicine

## 2021-06-22 ENCOUNTER — Encounter: Payer: Self-pay | Admitting: Family Medicine

## 2021-06-22 ENCOUNTER — Other Ambulatory Visit: Payer: Self-pay

## 2021-06-22 VITALS — BP 101/70 | Temp 98.6°F | Ht 65.0 in | Wt 111.1 lb

## 2021-06-22 DIAGNOSIS — N924 Excessive bleeding in the premenopausal period: Secondary | ICD-10-CM | POA: Insufficient documentation

## 2021-06-22 DIAGNOSIS — R768 Other specified abnormal immunological findings in serum: Secondary | ICD-10-CM | POA: Diagnosis not present

## 2021-06-22 DIAGNOSIS — N941 Unspecified dyspareunia: Secondary | ICD-10-CM | POA: Diagnosis not present

## 2021-06-22 DIAGNOSIS — Z124 Encounter for screening for malignant neoplasm of cervix: Secondary | ICD-10-CM | POA: Insufficient documentation

## 2021-06-22 DIAGNOSIS — G43909 Migraine, unspecified, not intractable, without status migrainosus: Secondary | ICD-10-CM

## 2021-06-22 DIAGNOSIS — Z3169 Encounter for other general counseling and advice on procreation: Secondary | ICD-10-CM | POA: Insufficient documentation

## 2021-06-22 DIAGNOSIS — R41840 Attention and concentration deficit: Secondary | ICD-10-CM

## 2021-06-22 MED ORDER — SUMATRIPTAN SUCCINATE 100 MG PO TABS: 100.0000 mg | ORAL_TABLET | ORAL | 3 refills | Status: DC | PRN

## 2021-06-22 NOTE — Assessment & Plan Note (Signed)
Pulled for medication refill 

## 2021-06-22 NOTE — Assessment & Plan Note (Signed)
Pulled for referral

## 2021-06-22 NOTE — Assessment & Plan Note (Signed)
Discussion regarding foreplay; use of water based lube; mutual consent Pt with anteverted uterus on exam; notified and addressed possible source of pain

## 2021-06-22 NOTE — Progress Notes (Signed)
Established patient visit   Patient: Robin Whitaker   DOB: [birth year]   35 y.o. Female  MRN: [id number] Visit Date: 06/22/2021  Today's healthcare provider: Jacky Kindle, FNP   Hep C AB positive  Subjective    HPI  Started bleeding today and has bad cramps and clotting. Believes she may have miscarried 2 menstrual periods ago due to huge clot that was released. Was not seen for it after. Working on becoming pregnant  Diagnosed with Hep C at free clinic about 2 months ago. Has had before and was treated and was cleared of it. Partner was tested first and was positive so she tested after. Was at [facility]   Medications: Outpatient Medications Prior to Visit  Medication Sig   cyclobenzaprine (FLEXERIL) 10 MG tablet Take 1 tablet (10 mg total) by mouth at bedtime as needed for muscle spasms.   gabapentin (NEURONTIN) 400 MG capsule Take 1-2 capsules (400-800 mg total) by mouth 2 (two) times daily.   hydrOXYzine (ATARAX/VISTARIL) 10 MG tablet TAKE 1 TABLET BY MOUTH THREE TIMES DAILY AS NEEDED FOR ANXIETY MAY CAUSE DROWSINESS   ketorolac (TORADOL) 10 MG tablet Take 1 tablet (10 mg total) by mouth every 8 (eight) hours.   ondansetron (ZOFRAN ODT) 4 MG disintegrating tablet Take 1 tablet (4 mg total) by mouth every 8 (eight) hours as needed.   Vitamin D, Ergocalciferol, (DRISDOL) 1.25 MG (50000 UNIT) CAPS capsule Take 1 capsule (50,000 Units total) by mouth every 7 (seven) days. (taking one tablet per week)   [DISCONTINUED] SUMAtriptan (IMITREX) 100 MG tablet Take 1 tablet (100 mg total) by mouth every 2 (two) hours as needed for migraine. No more than 2 in a day   dicyclomine (BENTYL) 10 MG capsule Take 1 capsule (10 mg total) by mouth 4 (four) times daily for 10 days.   No facility-administered medications prior to visit.    Review of Systems     Objective    BP 101/70   Temp 98.6 F (37 C) (Oral)   Ht 5\' 5"  (1.651 m)   Wt 111 lb 1.6 oz (50.4 kg)   SpO2 98%   BMI  18.49 kg/m  {Show previous vital signs (optional):23777}  Physical Exam Vitals and nursing note reviewed.  Constitutional:      Appearance: Normal appearance. She is underweight.  HENT:     Head: Normocephalic and atraumatic.  Cardiovascular:     Rate and Rhythm: Normal rate.     Pulses: Normal pulses.  Pulmonary:     Effort: Pulmonary effort is normal.     Breath sounds: Normal breath sounds and air entry.  Genitourinary:    Vagina: Bleeding present.     Cervix: Normal and dilated.     Uterus: Deviated. Not enlarged and not tender.   Musculoskeletal:     Cervical back: Full passive range of motion without pain.     Right lower leg: No edema.     Left lower leg: No edema.  Neurological:     Mental Status: She is alert.     No results found for any visits on 06/22/21.  Assessment & Plan     Problem List Items Addressed This Visit       Cardiovascular and Mediastinum   Migraine without status migrainosus, not intractable    Pulled for medication refill      Relevant Medications   SUMAtriptan (IMITREX) 100 MG tablet     Other   Positive  hepatitis C antibody test - Primary    Positive test at free clinic; needs RNA testing Previous tested positive; and treated for Did not retest following treatment to ensure successful completion Partner also Hep C AB positive      Relevant Orders   HCV RNA quant   Cervical cancer screening    PAP completed today      Relevant Orders   Cytology - PAP   Pre-conception counseling    Wants to get pregnant; using condoms now that Hep C positive AB resulted      Abnormal vaginal bleeding in premenopausal patient    Large clots; concern for possible miscarriage per pt report Continue to monitor      Attention and concentration deficit    Pulled for referral       Relevant Orders   Ambulatory referral to Neuropsychology   Dyspareunia in female    Discussion regarding foreplay; use of water based lube; mutual  consent Pt with anteverted uterus on exam; notified and addressed possible source of pain        Return in about 8 weeks (around 08/17/2021).     Leilani Merl, FNP, have reviewed all documentation for this visit. The documentation on 06/22/21 for the exam, diagnosis, procedures, and orders are all accurate and complete.    Jacky Kindle, FNP  [facility] [phone] (phone) [phone] (fax)  [facility]

## 2021-06-22 NOTE — Assessment & Plan Note (Signed)
Wants to get pregnant; using condoms now that Hep C positive AB resulted

## 2021-06-22 NOTE — Assessment & Plan Note (Signed)
Large clots; concern for possible miscarriage per pt report Continue to monitor

## 2021-06-22 NOTE — Assessment & Plan Note (Signed)
Positive test at free clinic; needs RNA testing Previous tested positive; and treated for Did not retest following treatment to ensure successful completion Partner also Hep C AB positive

## 2021-06-22 NOTE — Assessment & Plan Note (Signed)
PAP completed today 

## 2021-06-24 ENCOUNTER — Other Ambulatory Visit: Payer: Self-pay | Admitting: Family Medicine

## 2021-06-24 DIAGNOSIS — R768 Other specified abnormal immunological findings in serum: Secondary | ICD-10-CM

## 2021-06-24 LAB — CYTOLOGY - PAP: Diagnosis: NEGATIVE

## 2021-06-24 LAB — HCV RNA QUANT
HCV log10: 3.713 log10 IU/mL
Hepatitis C Quantitation: 5160 IU/mL

## 2021-06-24 NOTE — Progress Notes (Signed)
Please call patient to let them know referral has been placed with Hep C clinic.  Please let us know if you have any questions.  Thank you,  Merita Norton, FNP

## 2021-06-28 ENCOUNTER — Ambulatory Visit: Payer: Medicaid Other | Attending: Infectious Diseases | Admitting: Infectious Diseases

## 2021-06-28 ENCOUNTER — Other Ambulatory Visit: Payer: Self-pay

## 2021-06-28 ENCOUNTER — Encounter: Payer: Self-pay | Admitting: Infectious Diseases

## 2021-06-28 ENCOUNTER — Other Ambulatory Visit
Admission: RE | Admit: 2021-06-28 | Discharge: 2021-06-28 | Disposition: A | Discharge status: Home / Self Care | Payer: Medicaid Other | Attending: Infectious Diseases | Admitting: Infectious Diseases

## 2021-06-28 VITALS — BP 115/81 | HR 88 | Resp 16 | Ht 65.0 in | Wt 112.2 lb

## 2021-06-28 DIAGNOSIS — Z79899 Other long term (current) drug therapy: Secondary | ICD-10-CM | POA: Diagnosis not present

## 2021-06-28 DIAGNOSIS — B182 Chronic viral hepatitis C: Secondary | ICD-10-CM | POA: Diagnosis present

## 2021-06-28 DIAGNOSIS — G43909 Migraine, unspecified, not intractable, without status migrainosus: Secondary | ICD-10-CM | POA: Diagnosis present

## 2021-06-28 DIAGNOSIS — F17290 Nicotine dependence, other tobacco product, uncomplicated: Secondary | ICD-10-CM | POA: Insufficient documentation

## 2021-06-28 LAB — COMPREHENSIVE METABOLIC PANEL
ALT: 42 U/L (ref 0–44)
AST: 31 U/L (ref 15–41)
Albumin: 4.8 g/dL (ref 3.5–5.0)
Alkaline Phosphatase: 63 U/L (ref 38–126)
Anion gap: 11 (ref 5–15)
BUN: 10 mg/dL (ref 6–20)
CO2: 28 mmol/L (ref 22–32)
Calcium: 9.9 mg/dL (ref 8.9–10.3)
Chloride: 99 mmol/L (ref 98–111)
Creatinine, Ser: 0.72 mg/dL (ref 0.44–1.00)
GFR, Estimated: >60 mL/min (ref >60)
Glucose, Bld: 91 mg/dL (ref 70–99)
Potassium: 4.3 mmol/L (ref 3.5–5.1)
Sodium: 138 mmol/L (ref 135–145)
Total Bilirubin: 0.9 mg/dL (ref 0.3–1.2)
Total Protein: 8.6 g/dL — ABNORMAL HIGH (ref 6.5–8.1)

## 2021-06-28 LAB — CBC WITH DIFFERENTIAL/PLATELET
Abs Immature Granulocytes: 0.02 10*3/uL (ref 0.00–0.07)
Basophils Absolute: 0 10*3/uL (ref 0.0–0.1)
Basophils Relative: 1 %
Eosinophils Absolute: 0.3 10*3/uL (ref 0.0–0.5)
Eosinophils Relative: 4 %
HCT: 45 % (ref 36.0–46.0)
Hemoglobin: 15.3 g/dL — ABNORMAL HIGH (ref 12.0–15.0)
Immature Granulocytes: 0 %
Lymphocytes Relative: 28 %
Lymphs Abs: 1.8 10*3/uL (ref 0.7–4.0)
MCH: 32.7 pg (ref 26.0–34.0)
MCHC: 34 g/dL (ref 30.0–36.0)
MCV: 96.2 fL (ref 80.0–100.0)
Monocytes Absolute: 0.5 10*3/uL (ref 0.1–1.0)
Monocytes Relative: 7 %
Neutro Abs: 4 10*3/uL (ref 1.7–7.7)
Neutrophils Relative %: 60 %
Platelets: 262 10*3/uL (ref 150–400)
RBC: 4.68 MIL/uL (ref 3.87–5.11)
RDW: 12.9 % (ref 11.5–15.5)
WBC: 6.6 10*3/uL (ref 4.0–10.5)
nRBC: 0 % (ref 0.0–0.2)

## 2021-06-28 LAB — TSH: TSH: 1.673 u[IU]/mL (ref 0.350–4.500)

## 2021-06-28 LAB — PROTIME-INR
INR: 1 (ref 0.8–1.2)
Prothrombin Time: 12.8 seconds (ref 11.4–15.2)

## 2021-06-28 LAB — HEPATITIS A ANTIBODY, TOTAL: hep A Total Ab: NONREACTIVE

## 2021-06-28 LAB — HEPATITIS B SURFACE ANTIGEN: Hepatitis B Surface Ag: NONREACTIVE

## 2021-06-28 LAB — HIV ANTIBODY (ROUTINE TESTING W REFLEX): HIV Screen 4th Generation wRfx: NONREACTIVE

## 2021-06-28 LAB — APTT: aPTT: 31 seconds (ref 24–36)

## 2021-06-28 NOTE — Patient Instructions (Addendum)
You are here for [facility]- today we will do labs- we will get all your previous treatment records and then take it from there.

## 2021-06-28 NOTE — Progress Notes (Signed)
NAME: Robin Whitaker  DOB: 27-Nov-1985  MRN: [id number]  Date/Time: 06/28/2021 10:55 AM  Subjective:   ?pt referred to me for HEPC  Robin Whitaker is a 35 y.o. female with a history of Migraine is referred to be for HEPC. She is here with her boyfriend who is also hepc positive Pt says 7 yrs ago when she wasin [place] she got treated for HEPC with Mayvret for 12 weeks and had a sustained virological response. She moved o [place] and has a new boyfriend for the past year. 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 As it came positive she saw her PCP and had RNA test and was referred ot me She has a 42 yr old son and he is negative Pt has no fever, nausea, vomiting, icterus Her current Vl is 5160 and genotype 1 a done on 06/22/21. She does not think her previous hepc was genotype 1 a She was planning to get pregnant but because of the hepc test becoming positive the plan is on hold- she and her boyfriend wants to get treated. She has insurance but he does not     Past Medical History:  Diagnosis Date   Allergy    Anxiety    Chronic back pain    Migraine     Past Surgical History:  Procedure Laterality Date   APPENDECTOMY     WISDOM TOOTH EXTRACTION      Social History   Socioeconomic History   Marital status: Single    Spouse name: Not on file   Number of children: Not on file   Years of education: Not on file   Highest education level: Not on file  Occupational History   Not on file  Tobacco Use   Smoking status: Every Day    Types: E-cigarettes   Smokeless tobacco: Current  Substance and Sexual Activity   Alcohol use: Not Currently   Drug use: Never   Sexual activity: Not Currently    Partners: Male    Birth control/protection: None  Other Topics Concern    Not on file  Social History Narrative   Not on file   Social Determinants of Health   Financial Resource Strain: Not on file  Food Insecurity: Not on file  Transportation Needs: Not on file  Physical Activity: Not on file  Stress: Not on file  Social Connections: Not on file  Intimate Partner Violence: Not on file    Family History  Problem Relation Age of Onset   Leukemia Brother    Arthritis Maternal Grandmother    Anuerysm Maternal Grandfather    Congestive Heart Failure Paternal Grandmother    Allergies  Allergen Reactions   Cortisone Other (See Comments)   Other     Trees, grass,dander   I? Current Outpatient Medications  Medication Sig Dispense Refill   acetaminophen (TYLENOL) 325 MG tablet Take 650 mg by mouth every 6 (six) hours as needed.     gabapentin (NEURONTIN) 400 MG capsule Take 1-2 capsules (400-800 mg total) by mouth 2 (two) times daily. 360 capsule 3   hydrOXYzine (ATARAX/VISTARIL) 10 MG tablet TAKE 1 TABLET BY MOUTH THREE TIMES DAILY AS NEEDED FOR ANXIETY MAY CAUSE DROWSINESS 15 tablet 0   SUMAtriptan (IMITREX) 100 MG  tablet Take 1 tablet (100 mg total) by mouth every 2 (two) hours as needed for migraine. No more than 2 in a day 12 tablet 3   dicyclomine (BENTYL) 10 MG capsule Take 1 capsule (10 mg total) by mouth 4 (four) times daily for 10 days. 40 capsule 0   No current facility-administered medications for this visit.     Abtx:  Anti-infectives (From admission, onward)    None       REVIEW OF SYSTEMS:  Const: negative fever, negative chills, negative weight loss Eyes: negative diplopia or visual changes, negative eye pain ENT: negative coryza, negative sore throat Resp: negative cough, hemoptysis, dyspnea Cards: negative for chest pain, palpitations, lower extremity edema GU: negative for frequency, dysuria and hematuria GI: Negative for abdominal pain, diarrhea, bleeding, constipation Skin: negative for rash and pruritus Heme: negative  for easy bruising and gum/nose bleeding MS: negative for myalgias, arthralgias, back pain and muscle weakness Neurolo:negative for headaches, dizziness, vertigo, memory problems  Psych: negative for feelings of anxiety, depression  Endocrine: negative for thyroid, diabetes Allergy/Immunology-as above Objective:  VITALS:  BP 115/81   Pulse 88   Resp 16   Ht 5\' 5"  (1.651 m)   Wt 112 lb 3.2 oz (50.9 kg)   SpO2 99%   BMI 18.67 kg/m  PHYSICAL EXAM:  General: Alert, cooperative, no distress, appears stated age.  Head: Normocephalic, without obvious abnormality, atraumatic. Eyes: Conjunctivae clear, anicteric sclerae. Pupils are equal ENT Nares normal. No drainage or sinus tenderness. Lips, mucosa, and tongue normal. No Thrush Neck: Supple, symmetrical, no adenopathy, thyroid: non tender no carotid bruit and no JVD. Back: No CVA tenderness. Lungs: Clear to auscultation bilaterally. No Wheezing or Rhonchi. No rales. Heart: Regular rate and rhythm, no murmur, rub or gallop. Abdomen: Soft, non-tender,not distended. Bowel sounds normal. No masses Extremities: atraumatic, no cyanosis. No edema. No clubbing Skin: tattoos Lymph: Cervical, supraclavicular normal. Neurologic: Grossly non-focal Pertinent Labs Lab Results    CBC    Component Value Date/Time   WBC 9.5 01/12/2021 1447   RBC 4.49 01/12/2021 1447   HGB 14.1 01/12/2021 1447   HGB 14.5 04/30/2020 0904   HCT 41.9 01/12/2021 1447   HCT 43.2 04/30/2020 0904   PLT 210 01/12/2021 1447   PLT 331 04/30/2020 0904   MCV 93.3 01/12/2021 1447   MCV 94 04/30/2020 0904   MCH 31.4 01/12/2021 1447   MCHC 33.7 01/12/2021 1447   RDW 13.0 01/12/2021 1447   RDW 12.2 04/30/2020 0904   LYMPHSABS 2.4 04/30/2020 0904   EOSABS 0.4 04/30/2020 0904   BASOSABS 0.1 04/30/2020 0904    CMP Latest Ref Rng & Units 01/12/2021 04/30/2020 04/19/2020  Glucose 70 - 99 mg/dL 87 80 04/21/2020)  BUN 6 - 20 mg/dL 17 9 7   Creatinine 0.44 - 1.00 mg/dL 637(C  5.88  Sodium 135 - 145 mmol/L 135 138 141  Potassium 3.5 - 5.1 mmol/L 4.1 4.4 4.0  Chloride 98 - 111 mmol/L 102 96 100  CO2 22 - 32 mmol/L 25 24 24   Calcium 8.9 - 10.3 mg/dL 9.3 5.02 9.6  Total Protein 6.0 - 8.5 g/dL - 7.2 6.7  Total Bilirubin 0.0 - 1.2 mg/dL - 0.3 7.74  Alkaline Phos 48 - 121 IU/L - 74 70  AST 0 - 40 IU/L - 13 14  ALT 0 - 32 IU/L - 13 11    Tests result DATE comment  HEPC RNA 5160 06/22/21   HEPC  Genotype 1a 06/22/21  Hepatitis B profile     Hepatitis A status     PT/PTT     AST, ALT, Bilirubin     Albumin     creatinine     HB/Platelet     HIV     AFP     TSH     comorbidities      tobacco     alcohol     drug     APRI Score     FIB 4 score     Current meds Gabapentin Imitrex Tylenol     Ultrasound Elastography                   ? Impression/Recommendation ? HepC - previously treated with glecaprevir and pibrentasvir and achieved sustained virological response 7 yrs ago in [place] as per the patient-  So is this a new infection or rebound? Will need ot get her records from the provider who treated her- She has signed consent form Will do all the labs that is needed for treatment Partner will work on trying to get insurance /?or will tree to get free treatment- labs will be a problem- so will work on that  Migraine- on imitrex  Conseled on not getting pregnant until completion of treatment  ___________________________________________________ Discussed with patient,and partner Follow up 4 weeks Note:  This document was prepared using Dragon voice recognition software and may include unintentional dictation errors.

## 2021-06-29 LAB — HCV FIBROSURE
ALPHA 2-MACROGLOBULINS, QN: 230 mg/dL (ref 110–276)
ALT (SGPT) P5P: 51 IU/L — ABNORMAL HIGH (ref 0–40)
Apolipoprotein A-1: 149 mg/dL (ref 116–209)
Bilirubin, Total: 0.4 mg/dL (ref 0.0–1.2)
Fibrosis Score: 0.1 (ref 0.00–0.21)
GGT: 16 IU/L (ref 0–60)
Haptoglobin: 122 mg/dL (ref 33–278)
Necroinflammat Activity Score: 0.24 — ABNORMAL HIGH (ref 0.00–0.17)

## 2021-06-29 LAB — HEPATITIS B SURFACE ANTIBODY, QUANTITATIVE: Hep B S AB Quant (Post): 846.7 m[IU]/mL (ref >9.9)

## 2021-06-29 LAB — AFP TUMOR MARKER: AFP, Serum, Tumor Marker: 2.7 ng/mL (ref 0.0–6.4)

## 2021-07-02 LAB — HEPATITIS C GENOTYPE

## 2021-07-02 LAB — SPECIMEN STATUS REPORT

## 2021-07-05 ENCOUNTER — Telehealth: Payer: Self-pay

## 2021-07-05 NOTE — Telephone Encounter (Signed)
Went over labs and advised we need HEP C records from [place]. PCP records did not help. She did say she saw Dr Orvil Feil [phone]. I called and they did verbally verify she is a patient and I faxed new request for MR [phone]. Will await records.   Also advised her that she has U/S elastography scheduled for 07/20/2021 9 am. Needs to arrive 8:45 am and NPO after midnight prior. Patient was given address and understood appt details.

## 2021-07-20 ENCOUNTER — Ambulatory Visit: Admission: RE | Admit: 2021-07-20 | Payer: Medicaid Other | Source: Ambulatory Visit

## 2021-07-20 NOTE — Telephone Encounter (Signed)
Received call from [facility] stating the patient did not show for their liver elastography imaging appointment this morning.   Forwarding to CMA and provider.   Burnell Hurta Loyola Mast, RN

## 2021-07-27 ENCOUNTER — Ambulatory Visit
Admission: RE | Admit: 2021-07-27 | Discharge: 2021-07-27 | Disposition: A | Discharge status: Home / Self Care | Payer: Medicaid Other | Source: Ambulatory Visit | Attending: Infectious Diseases | Admitting: Infectious Diseases

## 2021-07-27 ENCOUNTER — Other Ambulatory Visit: Payer: Self-pay

## 2021-07-27 DIAGNOSIS — B182 Chronic viral hepatitis C: Secondary | ICD-10-CM | POA: Diagnosis present

## 2021-07-28 ENCOUNTER — Ambulatory Visit: Payer: Medicaid Other | Admitting: Infectious Diseases

## 2021-07-28 ENCOUNTER — Telehealth: Payer: Self-pay

## 2021-07-28 NOTE — Telephone Encounter (Signed)
Called [phone] to check on medical records. Faxed release last week. I left vm asking that someone call and discuss where records are.

## 2021-08-02 ENCOUNTER — Telehealth: Payer: Self-pay

## 2021-08-02 NOTE — Telephone Encounter (Signed)
Faxed Medical Record Request for second time and attempted to call but after hours can not see medical records progress. I have also left a message as they are closed for lunch in [place]. I will try again at 4:30 PM.

## 2021-08-04 ENCOUNTER — Other Ambulatory Visit: Payer: Self-pay | Admitting: Pharmacist

## 2021-08-04 ENCOUNTER — Other Ambulatory Visit (HOSPITAL_COMMUNITY): Payer: Self-pay

## 2021-08-04 ENCOUNTER — Telehealth: Payer: Self-pay

## 2021-08-04 ENCOUNTER — Other Ambulatory Visit: Payer: Self-pay

## 2021-08-04 ENCOUNTER — Ambulatory Visit: Payer: Medicaid Other | Attending: Infectious Diseases | Admitting: Infectious Diseases

## 2021-08-04 VITALS — BP 108/66 | HR 78 | Resp 16 | Ht 65.0 in | Wt 113.0 lb

## 2021-08-04 DIAGNOSIS — B182 Chronic viral hepatitis C: Secondary | ICD-10-CM

## 2021-08-04 DIAGNOSIS — G43909 Migraine, unspecified, not intractable, without status migrainosus: Secondary | ICD-10-CM | POA: Insufficient documentation

## 2021-08-04 DIAGNOSIS — B1920 Unspecified viral hepatitis C without hepatic coma: Secondary | ICD-10-CM | POA: Diagnosis not present

## 2021-08-04 DIAGNOSIS — Z8619 Personal history of other infectious and parasitic diseases: Secondary | ICD-10-CM | POA: Insufficient documentation

## 2021-08-04 DIAGNOSIS — Z79899 Other long term (current) drug therapy: Secondary | ICD-10-CM | POA: Diagnosis not present

## 2021-08-04 MED ORDER — SOFOSBUVIR-VELPATASVIR 400-100 MG PO TABS
1.0000 | ORAL_TABLET | Freq: Every day | ORAL | 2 refills | Status: DC
  Filled 2021-08-04 – 2021-08-30 (×2): qty 28, 28d supply, fill #0
  Filled 2021-09-20: qty 28, 28d supply, fill #1
  Filled 2021-10-17: qty 28, 28d supply, fill #2

## 2021-08-04 NOTE — Telephone Encounter (Signed)
[facility] Patient Advocate Encounter   Received notification from [facility] that prior authorization for Robin Whitaker is required.   PA submitted on 08/04/21 Key [id number] Status is pending    [facility] will continue to follow.   Clearance Coots, CPhT Specialty Pharmacy Patient [facility] for Infectious Disease Phone: [phone] Fax:  [phone]

## 2021-08-04 NOTE — Progress Notes (Signed)
NAME: Robin Whitaker  DOB: Jul 30, 1986  MRN: [id number]  Date/Time: 08/04/2021 12:13 PM  Subjective:   ?follow up visit for hepc to discuss all labs and see eligibility for rx Following taken from last note Robin Whitaker is a 35 y.o. female with a history of Migraine is referred to be for HEPC.   Pt says 7 yrs ago  when she was in [place] she tested positive for HEPC antibody, and then it was negative - then she went to [place] on 2018 and tested positive again- thein in 2019 she was in [place] , Oct 23 2017 her Vl was 20 million copies/ Genotype was 3  and she started Mayret for 8 weeks. She says she was undetectable after that  She moved to [facility] Jan 2021 and has a new boyfriend for the past year. 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 As it came positive she saw her PCP and had RNA test and was referred ot me She has a 67 yr old son and he is negative Pt has no fever, nausea, vomiting, icterus Her current Vl is 5160 and genotype 1 a done on 06/22/21.  She was planning to get pregnant but because of the hepc test becoming positive the plan is on hold- she and her boyfriend wants to get treated. She has insurance but he does not     Past Medical History:  Diagnosis Date   Allergy    Anxiety    Chronic back pain    Migraine     Past Surgical History:  Procedure Laterality Date   APPENDECTOMY     WISDOM TOOTH EXTRACTION      Social History   Socioeconomic History   Marital status: Single    Spouse name: Not on file   Number of children: Not on file   Years of education: Not on file   Highest education level: Not on file  Occupational History   Not on file  Tobacco Use   Smoking status: Every Day    Types: E-cigarettes   Smokeless tobacco: Current  Substance and  Sexual Activity   Alcohol use: Not Currently   Drug use: Never   Sexual activity: Not Currently    Partners: Male    Birth control/protection: None  Other Topics Concern   Not on file  Social History Narrative   Not on file   Social Determinants of Health   Financial Resource Strain: Not on file  Food Insecurity: Not on file  Transportation Needs: Not on file  Physical Activity: Not on file  Stress: Not on file  Social Connections: Not on file  Intimate Partner Violence: Not on file    Family History  Problem Relation Age of Onset   Leukemia Brother    Arthritis Maternal Grandmother    Anuerysm Maternal Grandfather    Congestive Heart Failure Paternal Grandmother    Allergies  Allergen Reactions   Cortisone Other (See Comments)   Other     Trees, grass,dander   I? Current Outpatient Medications  Medication Sig Dispense Refill   acetaminophen (TYLENOL) 325 MG tablet Take 650 mg by mouth every 6 (six) hours as needed.     gabapentin (NEURONTIN) 400 MG capsule Take 1-2 capsules (400-800 mg total)  by mouth 2 (two) times daily. 360 capsule 3   hydrOXYzine (ATARAX/VISTARIL) 10 MG tablet TAKE 1 TABLET BY MOUTH THREE TIMES DAILY AS NEEDED FOR ANXIETY MAY CAUSE DROWSINESS 15 tablet 0   SUMAtriptan (IMITREX) 100 MG tablet Take 1 tablet (100 mg total) by mouth every 2 (two) hours as needed for migraine. No more than 2 in a day 12 tablet 3   dicyclomine (BENTYL) 10 MG capsule Take 1 capsule (10 mg total) by mouth 4 (four) times daily for 10 days. 40 capsule 0   No current facility-administered medications for this visit.     Abtx:  Anti-infectives (From admission, onward)    None       REVIEW OF SYSTEMS:  Const: negative fever, negative chills, thinks she has  weight loss but in 2019 weight was 110 and now 113 Eyes: negative diplopia or visual changes, negative eye pain ENT: negative coryza, negative sore throat Resp: negative cough, hemoptysis, dyspnea Cards: negative  for chest pain, palpitations, lower extremity edema GU: negative for frequency, dysuria and hematuria GI:  has some acid reflux Negative for abdominal pain, diarrhea, bleeding, constipation Skin: negative for rash and pruritus Heme: negative for easy bruising and gum/nose bleeding MS: negative for myalgias, arthralgias, back pain and muscle weakness Neurolo:negative for headaches, dizziness, vertigo, memory problems  Psych: negative for feelings of anxiety, depression  Endocrine: negative for thyroid, diabetes Allergy/Immunology-as above Objective:  VITALS:  BP 108/66   Pulse 78   Resp 16   Ht 5\' 5"  (1.651 m)   Wt 113 lb (51.3 kg)   SpO2 96%   BMI 18.80 kg/m  PHYSICAL EXAM:  General: Alert, cooperative, no distress, appears stated age.  Head: Normocephalic, without obvious abnormality, atraumatic. Eyes: Conjunctivae clear, anicteric sclerae. Pupils are equal ENT Nares normal. No drainage or sinus tenderness. Lips, mucosa, and tongue normal. No Thrush Neck: Supple, symmetrical, no adenopathy, thyroid: non tender no carotid bruit and no JVD. Back: No CVA tenderness. Lungs: Clear to auscultation bilaterally. No Wheezing or Rhonchi. No rales. Heart: Regular rate and rhythm, no murmur, rub or gallop. Abdomen: Soft, non-tender,not distended. Bowel sounds normal. No masses Extremities: atraumatic, no cyanosis. No edema. No clubbing Skin: tattoos Lymph: Cervical, supraclavicular normal. Neurologic: Grossly non-focal Pertinent Labs Lab Results    CBC    Component Value Date/Time   WBC 6.6 06/28/2021 1150   RBC 4.68 06/28/2021 1150   HGB 15.3 (H) 06/28/2021 1150   HGB 14.5 04/30/2020 0904   HCT 45.0 06/28/2021 1150   HCT 43.2 04/30/2020 0904   PLT 262 06/28/2021 1150   PLT 331 04/30/2020 0904   MCV 96.2 06/28/2021 1150   MCV 94 04/30/2020 0904   MCH 32.7 06/28/2021 1150   MCHC 34.0 06/28/2021 1150   RDW 12.9 06/28/2021 1150   RDW 12.2 04/30/2020 0904   LYMPHSABS 1.8  06/28/2021 1150   LYMPHSABS 2.4 04/30/2020 0904   MONOABS 0.5 06/28/2021 1150   EOSABS 0.3 06/28/2021 1150   EOSABS 0.4 04/30/2020 0904   BASOSABS 0.0 06/28/2021 1150   BASOSABS 0.1 04/30/2020 0904    CMP Latest Ref Rng & Units 06/28/2021 01/12/2021 04/30/2020  Glucose 70 - 99 mg/dL 91 87 80  BUN 6 - 20 mg/dL 10 17 9   Creatinine 0.44 - 1.00 mg/dL 0.72 0.99 0.87  Sodium 135 - 145 mmol/L 138 135 138  Potassium 3.5 - 5.1 mmol/L 4.3 4.1 4.4  Chloride 98 - 111 mmol/L 99 102 96  CO2 22 - 32 mmol/L  28 25 24   Calcium 8.9 - 10.3 mg/dL 9.9 9.3 10.1  Total Protein 6.5 - 8.1 g/dL 8.6(H) - 7.2  Total Bilirubin 0.3 - 1.2 mg/dL 0.9 - 0.3  Alkaline Phos 38 - 126 U/L 63 - 74  AST 15 - 41 U/L 31 - 13  ALT 0 - 44 U/L 42 - 13    Tests result DATE comment  HEPC RNA 5160 06/22/21   HEPC  Genotype 1a 06/22/21   Hepatitis B profile Vaccinated-    Hepatitis A status NR    PT/PTT 12.8/31    AST, ALT, Bilirubin 31/42/0.9    Albumin 4.8    creatinine 0.72    HB/Platelet 15.3/262    HIV NR    AFP 2.7    TSH 1.673    comorbidities      tobacco smoker    alcohol None    drug     APRI Score     FIB 4 score     Current meds Gabapentin Imitrex Tylenol     Ultrasound Elastography KPA  6.3 <9 n    fibrosure No fibrosis Necroinflammation A1             ? Impression/Recommendation ? 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

## 2021-08-04 NOTE — Patient Instructions (Addendum)
You are eligible to be treated for hepc and we will get PA for epclusa one pill a day  which you will take for 12 weeks. You will get this from [facility] and they will call you

## 2021-08-23 ENCOUNTER — Telehealth: Payer: Self-pay

## 2021-08-23 ENCOUNTER — Other Ambulatory Visit (HOSPITAL_COMMUNITY): Payer: Self-pay

## 2021-08-23 NOTE — Telephone Encounter (Signed)
Advised patient that we are waiting on Clearance Coots to advise on why insurance denied PA. Once I know more I will reach out.

## 2021-08-29 ENCOUNTER — Other Ambulatory Visit (HOSPITAL_COMMUNITY): Payer: Self-pay

## 2021-08-30 ENCOUNTER — Other Ambulatory Visit (HOSPITAL_COMMUNITY): Payer: Self-pay

## 2021-08-30 ENCOUNTER — Telehealth: Payer: Self-pay

## 2021-08-30 NOTE — Telephone Encounter (Signed)
[facility] Patient Advocate Encounter  Prior Authorization for Robin Whitaker has been approved.    PA# [id number] Effective dates: 08/30/21 through 11/22/21  Patients co-pay is $0.00.   Medication will be delivered to the [facility] on 08/31/21  [facility] will continue to follow.  Clearance Coots, CPhT Specialty Pharmacy Patient [facility] for Infectious Disease Phone: [phone] Fax:  [phone]

## 2021-08-30 NOTE — Telephone Encounter (Signed)
Per Clearance Coots [facility]- Patients Prior Auth was denied 08/04/2021. She has filed an appeal but this may take some time. Advised to [facility] of the appeal process and will keep her up to date.   Per Clearance Coots APPROVED! Meds will be delivered to [facility] tomorrow and Danton Clap CMA will take meds to [facility] where patient will pick up on Thur AM. Advised patient who is very grateful for all the hard work Vira Blanco has put into the meds for her and her partner.

## 2021-08-31 ENCOUNTER — Telehealth: Payer: Self-pay

## 2021-08-31 NOTE — Telephone Encounter (Signed)
[facility] Patient Advocate Encounter  Patient's medications have been couriered to [facility] from [facility] pharmacy and will be picked up 08/31/21.  Asher Muir RN will take medication to [facility].  Clearance Coots , CPhT Specialty Pharmacy Patient [facility] for Infectious Disease Phone: [phone] Fax:  [phone]

## 2021-09-01 ENCOUNTER — Telehealth: Payer: Self-pay

## 2021-09-01 NOTE — Telephone Encounter (Signed)
Patient came in to clinic at [facility] ID and picked up RX Epclusa. She has gone over risks and how to's with provider at previous visit with partner also being treated. Next RX should be mailed to her home according to pharmacist Clearance Coots ar [facility]> [id number].

## 2021-09-05 ENCOUNTER — Telehealth: Payer: Self-pay | Admitting: Student-PharmD

## 2021-09-05 NOTE — Telephone Encounter (Signed)
Called patient to review counseling points of Epclusa for treatment of hepatitis C. Counseled that this is a one tablet once daily regimen that she can take with or without food. No drug interactions noted in the chart. Counseled her to let her doctor know if she starts any new medications while on treatment. She has started [place] and reports doing well with no adverse effects thus far. Reviewed importance of adherence with no missed doses. She has no questions at this time.

## 2021-09-06 ENCOUNTER — Other Ambulatory Visit: Payer: Self-pay

## 2021-09-06 ENCOUNTER — Encounter: Payer: Self-pay | Admitting: Family Medicine

## 2021-09-06 ENCOUNTER — Ambulatory Visit (INDEPENDENT_AMBULATORY_CARE_PROVIDER_SITE_OTHER): Payer: Medicaid Other | Admitting: Family Medicine

## 2021-09-06 VITALS — BP 117/87 | HR 79 | Resp 16 | Wt 113.3 lb

## 2021-09-06 DIAGNOSIS — G43909 Migraine, unspecified, not intractable, without status migrainosus: Secondary | ICD-10-CM

## 2021-09-06 DIAGNOSIS — Z716 Tobacco abuse counseling: Secondary | ICD-10-CM | POA: Insufficient documentation

## 2021-09-06 DIAGNOSIS — G8929 Other chronic pain: Secondary | ICD-10-CM

## 2021-09-06 DIAGNOSIS — M542 Cervicalgia: Secondary | ICD-10-CM

## 2021-09-06 MED ORDER — METAXALONE 800 MG PO TABS: 800.0000 mg | ORAL_TABLET | Freq: Three times a day (TID) | ORAL | 0 refills | Status: DC

## 2021-09-06 MED ORDER — MELOXICAM 15 MG PO TABS: 15.0000 mg | ORAL_TABLET | Freq: Every day | ORAL | 0 refills | Status: DC

## 2021-09-06 MED ORDER — NURTEC 75 MG PO TBDP: 75.0000 | ORAL_TABLET | Freq: Every day | ORAL | 0 refills | Status: DC | PRN

## 2021-09-06 NOTE — Assessment & Plan Note (Signed)
Advise use of stretching and stress reduction to assist in management as well as proper hydration

## 2021-09-06 NOTE — Assessment & Plan Note (Signed)
Stable at this time, however, may be MSK in nature Was seen previously at ortho- did not improve Insurance/financial limitations impede care Advise use of mobic and muscle relaxants Pt does not have a microwave to make a heat pack or a heating pad Samples of nurtec provided

## 2021-09-06 NOTE — Assessment & Plan Note (Signed)
Has been smoking 1-2 cigs/day; does not feel that traditional methods like patch would be of assistance Wants to try hypinosis Referral placed to smoking cessation

## 2021-09-06 NOTE — Progress Notes (Signed)
Established patient visit   Patient: Robin Whitaker   DOB: 09-09-1986   35 y.o. Female  MRN: [id number] Visit Date: 09/06/2021  Today's healthcare provider: Jacky Kindle, FNP    Subjective    HPI  Follow up for migraine without status migrainosis, not intractable  The patient was last seen for this 3 months ago. Changes made at last visit include none continue on Imitrex.  She reports good compliance with treatment. She feels that condition is Unchanged. She is not having side effects.   -----------------------------------------------------------------------------------------   Medications: Outpatient Medications Prior to Visit  Medication Sig   gabapentin (NEURONTIN) 400 MG capsule Take 1-2 capsules (400-800 mg total) by mouth 2 (two) times daily.   Sofosbuvir-Velpatasvir (EPCLUSA) 400-100 MG TABS Take 1 tablet by mouth daily.   SUMAtriptan (IMITREX) 100 MG tablet Take 1 tablet (100 mg total) by mouth every 2 (two) hours as needed for migraine. No more than 2 in a day   acetaminophen (TYLENOL) 325 MG tablet Take 650 mg by mouth every 6 (six) hours as needed. (Patient not taking: Reported on 09/06/2021)   dicyclomine (BENTYL) 10 MG capsule Take 1 capsule (10 mg total) by mouth 4 (four) times daily for 10 days.   hydrOXYzine (ATARAX/VISTARIL) 10 MG tablet TAKE 1 TABLET BY MOUTH THREE TIMES DAILY AS NEEDED FOR ANXIETY MAY CAUSE DROWSINESS (Patient not taking: Reported on 09/06/2021)   No facility-administered medications prior to visit.    Review of Systems     Objective    BP 117/87   Pulse 79   Resp 16   Wt 113 lb 4.8 oz (51.4 kg)   SpO2 99%   BMI 18.85 kg/m    Physical Exam Vitals and nursing note reviewed.  Constitutional:      General: She is not in acute distress.    Appearance: Normal appearance. She is underweight. She is not ill-appearing, toxic-appearing or diaphoretic.  HENT:     Head: Normocephalic and atraumatic.  Cardiovascular:     Rate and  Rhythm: Normal rate and regular rhythm.     Pulses: Normal pulses.     Heart sounds: Normal heart sounds. No murmur heard.   No friction rub. No gallop.  Pulmonary:     Effort: Pulmonary effort is normal. No respiratory distress.     Breath sounds: Normal breath sounds. No stridor. No wheezing, rhonchi or rales.  Chest:     Chest wall: No tenderness.  Abdominal:     General: Bowel sounds are normal.     Palpations: Abdomen is soft.  Musculoskeletal:        General: Tenderness present. No swelling, deformity or signs of injury. Normal range of motion.       Arms:     Right lower leg: No edema.     Left lower leg: No edema.  Skin:    General: Skin is warm and dry.     Capillary Refill: Capillary refill takes less than 2 seconds.     Coloration: Skin is not jaundiced or pale.     Findings: No bruising, erythema, lesion or rash.  Neurological:     General: No focal deficit present.     Mental Status: She is alert and oriented to person, place, and time. Mental status is at baseline.     Cranial Nerves: No cranial nerve deficit.     Sensory: No sensory deficit.     Motor: No weakness.     Coordination: Coordination  normal.  Psychiatric:        Mood and Affect: Mood normal.        Behavior: Behavior normal.        Thought Content: Thought content normal.        Judgment: Judgment normal.     No results found for any visits on 09/06/21.  Assessment & Plan     Problem List Items Addressed This Visit       Cardiovascular and Mediastinum   Migraine without status migrainosus, not intractable - Primary    Stable at this time, however, may be MSK in nature Was seen previously at ortho- did not improve Insurance/financial limitations impede care Advise use of mobic and muscle relaxants Pt does not have a microwave to make a heat pack or a heating pad Samples of nurtec provided       Relevant Medications   meloxicam (MOBIC) 15 MG tablet   metaxalone (SKELAXIN) 800 MG tablet    Rimegepant Sulfate (NURTEC) 75 MG TBDP   Other Relevant Orders   Ambulatory referral to Neurology     Other   Chronic neck pain    Advise use of stretching and stress reduction to assist in management as well as proper hydration      Relevant Medications   meloxicam (MOBIC) 15 MG tablet   metaxalone (SKELAXIN) 800 MG tablet   Tobacco abuse counseling    Has been smoking 1-2 cigs/day; does not feel that traditional methods like patch would be of assistance Wants to try hypinosis Referral placed to smoking cessation       Relevant Orders   Ambulatory referral to Smoking Cessation Program    Return in about 3 months (around 12/05/2021), or if symptoms worsen or fail to improve.      Leilani Merl, FNP, have reviewed all documentation for this visit. The documentation on 09/06/21 for the exam, diagnosis, procedures, and orders are all accurate and complete.    Jacky Kindle, FNP  [facility] [phone] (phone) [phone] (fax)  [facility]

## 2021-09-06 NOTE — Patient Instructions (Addendum)
Start head-ache journal Hydrate- 64 oz of water minimum per day

## 2021-09-15 ENCOUNTER — Other Ambulatory Visit (HOSPITAL_COMMUNITY): Payer: Self-pay

## 2021-09-20 ENCOUNTER — Other Ambulatory Visit (HOSPITAL_COMMUNITY): Payer: Self-pay

## 2021-09-21 ENCOUNTER — Telehealth: Payer: Self-pay

## 2021-09-21 NOTE — Telephone Encounter (Signed)
[facility] Patient Advocate Encounter ° °Patient's medications have been couriered to [facility] from [facility] and will be picked up 09/21/21. ° °Lorynn Moeser , CPhT °Specialty Pharmacy Patient Advocate °[facility] °Phone: [phone] °Fax:  [phone]  °

## 2021-09-28 ENCOUNTER — Telehealth: Payer: Self-pay

## 2021-09-28 NOTE — Telephone Encounter (Signed)
Attempted to contact in regards to RX being picked up at [facility]. If patient can not pick up Robin Whitaker can bring to [facility].

## 2021-10-12 ENCOUNTER — Other Ambulatory Visit (HOSPITAL_COMMUNITY): Payer: Self-pay

## 2021-10-17 ENCOUNTER — Other Ambulatory Visit (HOSPITAL_COMMUNITY): Payer: Self-pay

## 2021-10-19 ENCOUNTER — Telehealth: Payer: Self-pay

## 2021-10-19 ENCOUNTER — Other Ambulatory Visit (HOSPITAL_COMMUNITY): Payer: Self-pay

## 2021-10-19 NOTE — Telephone Encounter (Signed)
[facility] Patient Advocate Encounter  Patient's medications have been couriered to [facility] from [facility] and will be picked up 10/19/21.  Asher Muir will take medication over to [facility] for patient to pick up medication at that location.   Clearance Coots , CPhT Specialty Pharmacy Patient [facility] Phone: [phone] Fax:  [phone]

## 2021-10-27 ENCOUNTER — Ambulatory Visit: Payer: Medicaid Other | Attending: Infectious Diseases | Admitting: Infectious Diseases

## 2021-10-27 ENCOUNTER — Other Ambulatory Visit
Admission: RE | Admit: 2021-10-27 | Discharge: 2021-10-27 | Disposition: A | Discharge status: Home / Self Care | Payer: Medicaid Other | Attending: Infectious Diseases | Admitting: Infectious Diseases

## 2021-10-27 ENCOUNTER — Other Ambulatory Visit: Payer: Self-pay

## 2021-10-27 ENCOUNTER — Telehealth: Payer: Self-pay

## 2021-10-27 ENCOUNTER — Encounter: Payer: Self-pay | Admitting: Infectious Diseases

## 2021-10-27 VITALS — BP 108/59 | HR 77 | Resp 16 | Ht 65.0 in | Wt 113.0 lb

## 2021-10-27 DIAGNOSIS — B182 Chronic viral hepatitis C: Secondary | ICD-10-CM | POA: Insufficient documentation

## 2021-10-27 DIAGNOSIS — G43909 Migraine, unspecified, not intractable, without status migrainosus: Secondary | ICD-10-CM | POA: Diagnosis not present

## 2021-10-27 LAB — COMPREHENSIVE METABOLIC PANEL
ALT: 12 U/L (ref 0–44)
AST: 17 U/L (ref 15–41)
Albumin: 4.1 g/dL (ref 3.5–5.0)
Alkaline Phosphatase: 53 U/L (ref 38–126)
Anion gap: 6 (ref 5–15)
BUN: 17 mg/dL (ref 6–20)
CO2: 28 mmol/L (ref 22–32)
Calcium: 9.3 mg/dL (ref 8.9–10.3)
Chloride: 105 mmol/L (ref 98–111)
Creatinine, Ser: 0.82 mg/dL (ref 0.44–1.00)
GFR, Estimated: >60 mL/min (ref >60)
Glucose, Bld: 79 mg/dL (ref 70–99)
Potassium: 4.3 mmol/L (ref 3.5–5.1)
Sodium: 139 mmol/L (ref 135–145)
Total Bilirubin: 0.4 mg/dL (ref 0.3–1.2)
Total Protein: 7.1 g/dL (ref 6.5–8.1)

## 2021-10-27 NOTE — Progress Notes (Signed)
NAME: Robin Whitaker  DOB: December 12, 1985  MRN: [id number]  Date/Time: 10/27/2021 12:07 PM  Subjective:   Robin Whitaker is a 36 y.o. female with a history of hepatitis C and migraine.  Follow up visit for HEPC- she started treatment 6 weeks ago On [place] Has missed 4 doses No side effects Tolerating it very well  Following taken from the note before Pt says 7 yrs ago  when she was in [place] she tested positive for HEPC antibody, and then it was negative - then she went to [place] on 2018 and tested positive again- thein in 2019 she was in [place] , Oct 23 2017 her Vl was 20 million copies/ Genotype was 3  and she started Mayret for 8 weeks. She says she was undetectable after that  She moved to [facility] [place] [facility] Jan 2021 and has a new boyfriend for the past year. 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 As it came positive she saw her PCP and had RNA test and was referred ot me She has a 9 yr old son and he is negative Pt has no fever, nausea, vomiting, icterus      Past Medical History:  Diagnosis Date   Allergy    Anxiety    Chronic back pain    Migraine     Past Surgical History:  Procedure Laterality Date   APPENDECTOMY     WISDOM TOOTH EXTRACTION      Social History   Socioeconomic History   Marital status: Single    Spouse name: Not on file   Number of children: Not on file   Years of education: Not on file   Highest education level: Not on file  Occupational History   Not on file  Tobacco Use   Smoking status: Every Day    Types: E-cigarettes   Smokeless tobacco: Current  Substance and Sexual Activity   Alcohol use: Not Currently   Drug use: Never   Sexual activity: Not Currently    Partners: Male    Birth control/protection: None  Other Topics Concern   Not  on file  Social History Narrative   Not on file   Social Determinants of Health   Financial Resource Strain: Not on file  Food Insecurity: Not on file  Transportation Needs: Not on file  Physical Activity: Not on file  Stress: Not on file  Social Connections: Not on file  Intimate Partner Violence: Not on file    Family History  Problem Relation Age of Onset   Leukemia Brother    Arthritis Maternal Grandmother    Anuerysm Maternal Grandfather    Congestive Heart Failure Paternal Grandmother    Allergies  Allergen Reactions   Cortisone Other (See Comments)   Other     Trees, grass,dander   I? Current Outpatient Medications  Medication Sig Dispense Refill   acetaminophen (TYLENOL) 325 MG tablet Take 650 mg by mouth every 6 (six) hours as needed.     gabapentin (NEURONTIN) 400 MG capsule Take 1-2 capsules (400-800 mg total) by mouth 2 (two) times daily. 360 capsule 3   Rimegepant Sulfate (NURTEC) 75 MG TBDP Take 75 tablets by mouth daily as needed. 8 tablet 0   Sofosbuvir-Velpatasvir (EPCLUSA) 400-100 MG TABS Take 1 tablet  by mouth daily. 28 tablet 2   SUMAtriptan (IMITREX) 100 MG tablet Take 1 tablet (100 mg total) by mouth every 2 (two) hours as needed for migraine. No more than 2 in a day 12 tablet 3   No current facility-administered medications for this visit.     Abtx:  Anti-infectives (From admission, onward)    None       REVIEW OF SYSTEMS:  Const: negative fever, negative chills, thinks she has  weight loss but in 2019 weight was 110 and now 113 Eyes: negative diplopia or visual changes, negative eye pain ENT: negative coryza, negative sore throat Resp: negative cough, hemoptysis, dyspnea Cards: negative for chest pain, palpitations, lower extremity edema GU: negative for frequency, dysuria and hematuria GI:  has some acid reflux Negative for abdominal pain, diarrhea, bleeding, constipation Skin: negative for rash and pruritus Heme: negative for easy  bruising and gum/nose bleeding MS: negative for myalgias, arthralgias, back pain and muscle weakness Neurolo:negative for headaches, dizziness, vertigo, memory problems  Psych: negative for feelings of anxiety, depression  Endocrine: negative for thyroid, diabetes Allergy/Immunology-as above Objective:  VITALS:  BP (!) 108/59    Pulse 77    Resp 16    Ht 5\' 5"  (1.651 m)    Wt 113 lb (51.3 kg)    SpO2 99%    BMI 18.80 kg/m  PHYSICAL EXAM:  General: Alert, cooperative, no distress, appears stated age.  Head: Normocephalic, without obvious abnormality, atraumatic. Eyes: Conjunctivae clear, anicteric sclerae. Pupils are equal ENT Nares normal. No drainage or sinus tenderness. Lips, mucosa, and tongue normal. No Thrush Neck: Supple, symmetrical, no adenopathy, thyroid: non tender no carotid bruit and no JVD. Back: No CVA tenderness. Lungs: Clear to auscultation bilaterally. No Wheezing or Rhonchi. No rales. Heart: Regular rate and rhythm, no murmur, rub or gallop. Abdomen: Soft, non-tender,not distended. Bowel sounds normal. No masses Extremities: atraumatic, no cyanosis. No edema. No clubbing Skin: tattoos Lymph: Cervical, supraclavicular normal. Neurologic: Grossly non-focal Pertinent Labs Lab Results   Tests result DATE comment  HEPC RNA 5160 06/22/21   HEPC  Genotype 1a 06/22/21   Hepatitis B profile Vaccinated-    Hepatitis A status NR    PT/PTT 12.8/31    AST, ALT, Bilirubin 31/42/0.9    Albumin 4.8    creatinine 0.72    HB/Platelet 15.3/262    HIV NR    AFP 2.7    TSH 1.673    comorbidities      tobacco smoker    alcohol None    drug     APRI Score     FIB 4 score     Current meds Gabapentin Imitrex Tylenol     Ultrasound Elastography KPA  6.3 <9 n    fibrosure No fibrosis Necroinflammation A1             ? Impression/Recommendation ? HepC - Genotype 1 a , low viral load on epclusa- nearly 8 weeks of treatment. Has 4 more weeks to go- she is collecting  medications today   previously treated with glecaprevir and pibrentasvir and achieved sustained virological response 3 yrs ago in [place] -had genotype 3 at that time    Migraine- on imitrex  Counseled on not getting pregnant until completion of treatment   Labs today VL and LFTS ___________________________________________________  Follow up 12 weeks Note:  This document was prepared using Dragon voice recognition software and may include unintentional dictation errors.

## 2021-10-27 NOTE — Telephone Encounter (Signed)
Robin Whitaker was given to patient while in office visit at [facility] ID>

## 2021-10-27 NOTE — Patient Instructions (Addendum)
You are here for follow of HEPC -you are on epclusa- today will check labs

## 2021-10-28 LAB — HCV RNA QUANT: HCV Quantitative: NOT DETECTED IU/mL (ref >50)

## 2021-11-01 ENCOUNTER — Telehealth: Payer: Self-pay

## 2021-11-01 NOTE — Telephone Encounter (Signed)
I spoke to the patient and relay lab results. Patient's Hep C viral load is undetectable per Dr. Rivka Safer. Patient verbalized understanding. Robin Whitaker, CMA

## 2021-11-14 ENCOUNTER — Other Ambulatory Visit (HOSPITAL_COMMUNITY): Payer: Self-pay

## 2021-12-22 ENCOUNTER — Telehealth: Payer: Self-pay

## 2021-12-22 NOTE — Telephone Encounter (Signed)
Copied from CRM [id number]. Topic: General - Inquiry ?>> Dec 22, 2021  2:10 PM Robin Whitaker wrote: ?This pt at [phone] stated that [name] called her and left a message to return call, called office, rolled over. She states that she did not know what it was for, could not find documentation so if legit call return call to pt ?

## 2022-06-08 ENCOUNTER — Encounter: Payer: Self-pay | Admitting: Physician Assistant

## 2022-06-08 ENCOUNTER — Encounter: Payer: Medicaid Other | Admitting: Physician Assistant

## 2022-06-08 NOTE — Progress Notes (Signed)
Trying to submit a visit for her son through her chart as he does not have an active chart. Gave her instructions to help get him set up for a visit.

## 2022-06-16 ENCOUNTER — Other Ambulatory Visit: Payer: Self-pay | Admitting: *Deleted

## 2022-06-16 ENCOUNTER — Ambulatory Visit: Payer: Self-pay | Admitting: *Deleted

## 2022-06-16 ENCOUNTER — Other Ambulatory Visit: Payer: Self-pay | Admitting: Physician Assistant

## 2022-06-16 DIAGNOSIS — G43909 Migraine, unspecified, not intractable, without status migrainosus: Secondary | ICD-10-CM

## 2022-06-16 MED ORDER — SUMATRIPTAN SUCCINATE 100 MG PO TABS: 100.0000 mg | ORAL_TABLET | ORAL | 0 refills | Status: DC | PRN

## 2022-06-16 NOTE — Telephone Encounter (Signed)
Requested Prescriptions  Pending Prescriptions Disp Refills  . SUMAtriptan (IMITREX) 100 MG tablet 12 tablet 0    Sig: Take 1 tablet (100 mg total) by mouth every 2 (two) hours as needed for migraine. No more than 2 in a day     Neurology:  Migraine Therapy - Triptan Passed - 06/16/2022 10:40 AM      Passed - Last BP in normal range    BP Readings from Last 1 Encounters:  10/27/21 (!) 108/59         Passed - Valid encounter within last 12 months    Recent Outpatient Visits          9 months ago Migraine without status migrainosus, not intractable, unspecified migraine type   [facility] Merita Norton T, FNP   11 months ago Positive hepatitis C antibody test   [facility] Merita Norton T, FNP   1 year ago Migraine without status migrainosus, not intractable, unspecified migraine type   [facility] Malva Limes, MD   2 years ago Anxiety   [facility] Flinchum, Eula Fried, FNP   2 years ago Neck pain   [facility] Flinchum, Eula Fried, FNP

## 2022-06-16 NOTE — Telephone Encounter (Signed)
Pt is at chiropractor for migraines and did not realize she needs a referral. Is asking for it to be faxed to [facility] fax # [phone].   Answer Assessment - Initial Assessment Questions 1. REASON FOR CALL or QUESTION: "What is your reason for calling today?" or "How can I best help you?" or "What question do you have that I can help answer?"     Needs referral  Protocols used: Information Only Call - No Triage-A-AH

## 2022-06-16 NOTE — Telephone Encounter (Signed)
Called pt to inform Merita Norton, FNP not in today. Office staff going to ask other providers if they will send referral. Voicemail with info left.

## 2022-06-16 NOTE — Telephone Encounter (Signed)
Can you place this for [facility] please.

## 2022-06-16 NOTE — Telephone Encounter (Signed)
done

## 2022-07-23 ENCOUNTER — Telehealth: Payer: Medicaid Other | Admitting: Nurse Practitioner

## 2022-07-23 DIAGNOSIS — M542 Cervicalgia: Secondary | ICD-10-CM

## 2022-07-23 DIAGNOSIS — M545 Low back pain, unspecified: Secondary | ICD-10-CM | POA: Diagnosis not present

## 2022-07-23 MED ORDER — METHOCARBAMOL 500 MG PO TABS: 500.0000 mg | ORAL_TABLET | Freq: Three times a day (TID) | ORAL | 0 refills | Status: DC | PRN

## 2022-07-23 NOTE — Progress Notes (Signed)
Virtual Visit Consent   Robin Whitaker, you are scheduled for a virtual visit with a [facility] provider today. Just as with appointments in the office, your consent must be obtained to participate. Your consent will be active for this visit and any virtual visit you may have with one of our providers in the next 365 days. If you have a MyChart account, a copy of this consent can be sent to you electronically.  As this is a virtual visit, video technology does not allow for your provider to perform a traditional examination. This may limit your provider's ability to fully assess your condition. If your provider identifies any concerns that need to be evaluated in person or the need to arrange testing (such as labs, EKG, etc.), we will make arrangements to do so. Although advances in technology are sophisticated, we cannot ensure that it will always work on either your end or our end. If the connection with a video visit is poor, the visit may have to be switched to a telephone visit. With either a video or telephone visit, we are not always able to ensure that we have a secure connection.  By engaging in this virtual visit, you consent to the provision of healthcare and authorize for your insurance to be billed (if applicable) for the services provided during this visit. Depending on your insurance coverage, you may receive a charge related to this service.  I need to obtain your verbal consent now. Are you willing to proceed with your visit today? Robin Whitaker has provided verbal consent on 07/23/2022 for a virtual visit (video or telephone). Robin Cantor, NP  Date: 07/23/2022 10:01 AM  Virtual Visit via Video Note   I, Robin Whitaker, connected with  Robin Whitaker  ([id number], 02-06-1986) on 07/23/22 at 10:00 AM EDT by a video-enabled telemedicine application and verified that I am speaking with the correct person using two identifiers.  Location: Patient: Virtual Visit  Location Patient: Home Provider: Virtual Visit Location Provider: Home   I discussed the limitations of evaluation and management by telemedicine and the availability of in person appointments. The patient expressed understanding and agreed to proceed.    History of Present Illness: Robin Whitaker is a 36 y.o. who identifies as a female who was assigned female at birth, and is being seen today for back and neck pain. She reports difficulty sleeping and trouble focusing. She also states that her eyes are "feeling different". She has been taking Tylenol and her mother's muscle relaxer. She states has not had any injury or trauma. She denies loss of bowel or bladder function, she reports she has been urinating more frequently. She states that she is unable to complete her normal activities. Her symptoms have been present for the past 2 months. She has been going to the chiropractor. The pain is located in her bilateral lower back. She states the pain is in her left neck and causes her to have headaches. She reports 10-15 "migraines" in the past month. She reports that she has chronic pain and is concerned that she has fibromyalgia. She is currently taking gabapentin.   HPI: HPI  Problems:  Patient Active Problem List   Diagnosis Date Noted   Tobacco abuse counseling 09/06/2021   Positive hepatitis C antibody test 06/22/2021   Cervical cancer screening 06/22/2021   Pre-conception counseling 06/22/2021   Abnormal vaginal bleeding in premenopausal patient 06/22/2021   Attention and concentration deficit 06/22/2021   Dyspareunia in female 06/22/2021  Vitamin D insufficiency 04/30/2020   Arthralgia 04/30/2020   Fatigue 04/30/2020   Elevated glucose 04/30/2020   Tick bite 04/30/2020   Urinary symptom or sign 04/30/2020   Palpitations 04/30/2020   Headache disorder 04/14/2020   Chronic neck pain 04/09/2020   Screening for blood or protein in urine 04/09/2020   History of scoliosis 04/09/2020    Scoliosis 04/09/2020   Acute midline low back pain with bilateral sciatica 04/09/2020   Anxiety 04/09/2020   Migraine without status migrainosus, not intractable 04/08/2020    Allergies:  Allergies  Allergen Reactions   Cortisone Other (See Comments)   Other     Trees, grass,dander   Medications:  Current Outpatient Medications:    acetaminophen (TYLENOL) 325 MG tablet, Take 650 mg by mouth every 6 (six) hours as needed., Disp: , Rfl:    gabapentin (NEURONTIN) 400 MG capsule, Take 1-2 capsules (400-800 mg total) by mouth 2 (two) times daily., Disp: 360 capsule, Rfl: 3   Rimegepant Sulfate (NURTEC) 75 MG TBDP, Take 75 tablets by mouth daily as needed., Disp: 8 tablet, Rfl: 0   Sofosbuvir-Velpatasvir (EPCLUSA) 400-100 MG TABS, Take 1 tablet by mouth daily., Disp: 28 tablet, Rfl: 2   SUMAtriptan (IMITREX) 100 MG tablet, Take 1 tablet (100 mg total) by mouth every 2 (two) hours as needed for migraine. No more than 2 in a day, Disp: 12 tablet, Rfl: 0  Observations/Objective: Patient is well-developed, well-nourished in no acute distress.  Resting comfortably at home.  Head is normocephalic, atraumatic.  No labored breathing.  Speech is clear and coherent with logical content.  Patient is alert and oriented at baseline.    Assessment and Plan: 1. Low back pain, unspecified back pain laterality, unspecified chronicity, unspecified whether sciatica present - methocarbamol (ROBAXIN) 500 MG tablet; Take 1 tablet (500 mg total) by mouth every 8 (eight) hours as needed for muscle spasms.  Dispense: 20 tablet; Refill: 0  2. Neck pain - methocarbamol (ROBAXIN) 500 MG tablet; Take 1 tablet (500 mg total) by mouth every 8 (eight) hours as needed for muscle spasms.  Dispense: 20 tablet; Refill: 0   Chronic neck and back pain with symptoms worsening over the past several weeks. No red flag symptoms noted during virtual exam. Methocarbamol 500mg  prescribed for symptoms. Patient advised to continue  OTC analgesics such as Ibuprofen or Tylenol. Recommend a face to face visit based on the chronicity of symptoms requiring further management. Patient given information for [facility] to find a PCP. Supportive care recommendations were provided to the patient. All questions were answered, understanding verbalized.   Follow Up Instructions: I discussed the assessment and treatment plan with the patient. The patient was provided an opportunity to ask questions and all were answered. The patient agreed with the plan and demonstrated an understanding of the instructions.  A copy of instructions were sent to the patient via MyChart unless otherwise noted below.    The patient was advised to call back or seek an in-person evaluation if the symptoms worsen or if the condition fails to improve as anticipated.  Time:  I spent 15 minutes with the patient via telehealth technology discussing the above problems/concerns.    Tish Men, NP

## 2022-07-23 NOTE — Patient Instructions (Signed)
  Jonita Albee, thank you for joining Tish Men, NP for today's virtual visit.  While this provider is not your primary care provider (PCP), if your PCP is located in our provider database this encounter information will be shared with them immediately following your visit.   [facility] account gives you access to today's visit and all your visits, tests, and labs performed at [facility] " click here if you don't have a [facility] account or go to [email or web address]  Consent: (Patient) Ashlyn Cabler provided verbal consent for this virtual visit at the beginning of the encounter.  Current Medications:  Current Outpatient Medications:    methocarbamol (ROBAXIN) 500 MG tablet, Take 1 tablet (500 mg total) by mouth every 8 (eight) hours as needed for muscle spasms., Disp: 20 tablet, Rfl: 0   acetaminophen (TYLENOL) 325 MG tablet, Take 650 mg by mouth every 6 (six) hours as needed., Disp: , Rfl:    gabapentin (NEURONTIN) 400 MG capsule, Take 1-2 capsules (400-800 mg total) by mouth 2 (two) times daily., Disp: 360 capsule, Rfl: 3   Rimegepant Sulfate (NURTEC) 75 MG TBDP, Take 75 tablets by mouth daily as needed., Disp: 8 tablet, Rfl: 0   Sofosbuvir-Velpatasvir (EPCLUSA) 400-100 MG TABS, Take 1 tablet by mouth daily., Disp: 28 tablet, Rfl: 2   SUMAtriptan (IMITREX) 100 MG tablet, Take 1 tablet (100 mg total) by mouth every 2 (two) hours as needed for migraine. No more than 2 in a day, Disp: 12 tablet, Rfl: 0   Medications ordered in this encounter:  Meds ordered this encounter  Medications   methocarbamol (ROBAXIN) 500 MG tablet    Sig: Take 1 tablet (500 mg total) by mouth every 8 (eight) hours as needed for muscle spasms.    Dispense:  20 tablet    Refill:  0     *If you need refills on other medications prior to your next appointment, please contact your pharmacy*  Follow-Up: Call back or seek an in-person evaluation if the symptoms  worsen or if the condition fails to improve as anticipated.  [place] [phone]  Other Instructions May take over-the-counter ibuprofen or Tylenol for pain or discomfort. RICE therapy rest, ice, compression, and elevation until your symptoms improve.  Apply ice for pain or swelling, heat for spasm or stiffness. Apply ice for 20 minutes, remove for 1 hour, then repeat as often as possible.   As discussed, please go to [email or web address], look for "find a doctor" to schedule an appointment with a primary care physician.    If you have been instructed to have an in-person evaluation today at a local Urgent Care facility, please use the link below. It will take you to a list of all of our available [facility], including address, phone number and hours of operation. Please do not delay care.  [facility]  If you or a family member do not have a primary care provider, use the link below to schedule a visit and establish care. When you choose a [facility] primary care physician or advanced practice provider, you gain a long-term partner in health. Find a Primary Care Provider  Learn more about [facility]'s in-office and virtual care options: [facility]

## 2022-07-25 NOTE — Progress Notes (Deleted)
      Established patient visit   Patient: Robin Whitaker   DOB: [birth year]   36 y.o. Female  MRN: [id number] Visit Date: 07/27/2022  Today's healthcare provider: Gwyneth Sprout, FNP   No chief complaint on file.  Subjective    HPI  Follow up for migraine  The patient was last seen for this 11 months ago. Changes made at last visit include samples of nurtec.  She reports {excellent/good/fair/poor:19665} compliance with treatment. She feels that condition is {improved/worse/unchanged:3041574}. She {is/is not:21021397} having side effects. ***  -----------------------------------------------------------------------------------------   Medications: Outpatient Medications Prior to Visit  Medication Sig   acetaminophen (TYLENOL) 325 MG tablet Take 650 mg by mouth every 6 (six) hours as needed.   gabapentin (NEURONTIN) 400 MG capsule Take 1-2 capsules (400-800 mg total) by mouth 2 (two) times daily.   methocarbamol (ROBAXIN) 500 MG tablet Take 1 tablet (500 mg total) by mouth every 8 (eight) hours as needed for muscle spasms.   Rimegepant Sulfate (NURTEC) 75 MG TBDP Take 75 tablets by mouth daily as needed.   Sofosbuvir-Velpatasvir (EPCLUSA) 400-100 MG TABS Take 1 tablet by mouth daily.   SUMAtriptan (IMITREX) 100 MG tablet Take 1 tablet (100 mg total) by mouth every 2 (two) hours as needed for migraine. No more than 2 in a day   No facility-administered medications prior to visit.    Review of Systems  {Labs  Heme  Chem  Endocrine  Serology  Results Review (optional):23779}   Objective    There were no vitals taken for this visit. {Show previous vital signs (optional):23777}  Physical Exam  ***  No results found for any visits on 07/27/22.  Assessment & Plan     ***  No follow-ups on file.      {provider attestation***:1}   Gwyneth Sprout, [place] [phone] (phone) [phone] (fax)  [place]

## 2022-07-27 ENCOUNTER — Ambulatory Visit (INDEPENDENT_AMBULATORY_CARE_PROVIDER_SITE_OTHER): Payer: Medicaid Other | Admitting: Physician Assistant

## 2022-07-27 ENCOUNTER — Ambulatory Visit: Payer: Medicaid Other | Admitting: Family Medicine

## 2022-07-27 ENCOUNTER — Encounter: Payer: Self-pay | Admitting: Physician Assistant

## 2022-07-27 VITALS — BP 115/75 | HR 74 | Resp 14 | Wt 113.0 lb

## 2022-07-27 DIAGNOSIS — Z716 Tobacco abuse counseling: Secondary | ICD-10-CM

## 2022-07-27 DIAGNOSIS — M545 Low back pain, unspecified: Secondary | ICD-10-CM | POA: Diagnosis not present

## 2022-07-27 DIAGNOSIS — M542 Cervicalgia: Secondary | ICD-10-CM | POA: Diagnosis not present

## 2022-07-27 DIAGNOSIS — G43909 Migraine, unspecified, not intractable, without status migrainosus: Secondary | ICD-10-CM

## 2022-07-27 DIAGNOSIS — R41840 Attention and concentration deficit: Secondary | ICD-10-CM

## 2022-07-27 DIAGNOSIS — F419 Anxiety disorder, unspecified: Secondary | ICD-10-CM

## 2022-07-27 DIAGNOSIS — G8929 Other chronic pain: Secondary | ICD-10-CM

## 2022-07-27 NOTE — Progress Notes (Signed)
I,[name],acting as a Education administrator for [employer], PA-C.,have documented all relevant documentation on the behalf of Mardene Speak, PA-C,as directed by  [employer], PA-C while in the presence of [employer], PA-C.   Established patient visit   Patient: Robin Whitaker   DOB: Feb 16, 1986   36 y.o. Female  MRN: [id number] Visit Date: 07/27/2022  Today's healthcare provider: Mardene Speak, PA-C   Chief Complaint  Patient presents with   Back Pain   Subjective    HPI  Patient wants to discuss chronic mid-back and left side of neck pain  Also wants to discuss her attention issues.  Medications: Outpatient Medications Prior to Visit  Medication Sig   acetaminophen (TYLENOL) 325 MG tablet Take 650 mg by mouth every 6 (six) hours as needed.   gabapentin (NEURONTIN) 400 MG capsule Take 1-2 capsules (400-800 mg total) by mouth 2 (two) times daily.   methocarbamol (ROBAXIN) 500 MG tablet Take 1 tablet (500 mg total) by mouth every 8 (eight) hours as needed for muscle spasms.   SUMAtriptan (IMITREX) 100 MG tablet Take 1 tablet (100 mg total) by mouth every 2 (two) hours as needed for migraine. No more than 2 in a day   [DISCONTINUED] Rimegepant Sulfate (NURTEC) 75 MG TBDP Take 75 tablets by mouth daily as needed.   [DISCONTINUED] Sofosbuvir-Velpatasvir (EPCLUSA) 400-100 MG TABS Take 1 tablet by mouth daily. (Patient not taking: Reported on 07/27/2022)   No facility-administered medications prior to visit.    Review of Systems  Constitutional:  Negative for appetite change, chills, fatigue and fever.  Respiratory:  Negative for chest tightness and shortness of breath.   Cardiovascular:  Negative for chest pain and palpitations.  Gastrointestinal:  Negative for abdominal pain, nausea and vomiting.  Neurological:  Negative for dizziness and weakness.  Psychiatric/Behavioral:  Positive for dysphoric mood and sleep disturbance. The patient is nervous/anxious.        Objective     BP 115/75 (BP Location: Right Arm, Patient Position: Sitting, Cuff Size: Normal)   Pulse 74   Resp 14   Wt 113 lb (51.3 kg)   SpO2 98%   BMI 18.80 kg/m    Physical Exam Vitals reviewed.  Constitutional:      General: She is not in acute distress.    Appearance: Normal appearance. She is well-developed. She is not diaphoretic.  HENT:     Head: Normocephalic and atraumatic.  Eyes:     General: No scleral icterus.    Conjunctiva/sclera: Conjunctivae normal.  Neck:     Thyroid: No thyromegaly.  Cardiovascular:     Rate and Rhythm: Normal rate and regular rhythm.     Pulses: Normal pulses.     Heart sounds: Normal heart sounds. No murmur heard. Pulmonary:     Effort: Pulmonary effort is normal. No respiratory distress.     Breath sounds: Normal breath sounds. No wheezing, rhonchi or rales.  Musculoskeletal:     Cervical back: Neck supple.     Right lower leg: No edema.     Left lower leg: No edema.  Lymphadenopathy:     Cervical: No cervical adenopathy.  Skin:    General: Skin is warm and dry.     Findings: No rash.  Neurological:     Mental Status: She is alert and oriented to person, place, and time. Mental status is at baseline.  Psychiatric:        Mood and Affect: Mood normal.  Behavior: Behavior normal.       No results found for any visits on 07/27/22.  Assessment & Plan     1. Neck pain Chronic and stable Symptomatic treatment advised: OTC pain medications, topical antiinflammatory ointments, wet heat, stretching exercise, a possible referral to PT and massage  2. Low back pain, unspecified back pain laterality, unspecified chronicity, unspecified whether sciatica present Chronic and stable Symptomatic treatment advised: OTC pain medications, topical antiinflammatory ointments, wet heat, stretching exercise, a possible referral to PT and massage  3. Migraine without status migrainosus, not intractable, unspecified migraine type Cervicogenic by  etiology Continue current management /Imitrex including Symptomatic treatment for neck pain  4. Tobacco abuse counseling Advised tobacco cessation Will fu with her PCP  5. 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  I discussed the assessment and treatment plan with the patient. The patient was provided an opportunity to ask questions and all were answered. The patient agreed with the plan and demonstrated an understanding of the instructions.  The entirety of the information documented in the History of Present Illness, Review of Systems and Physical Exam were personally obtained by me. Portions of this information were initially documented by the CMA and reviewed by me for thoroughness and accuracy.  Portions of this note were created using dictation software and may contain typographical errors.        Total encounter time more than 30 minutes  Greater than 50% was spent in counseling and coordination of care with the patient   Elberta Leatherwood  [facility] [phone] (phone) [phone] (fax)  [place]

## 2022-09-12 ENCOUNTER — Other Ambulatory Visit: Payer: Self-pay | Admitting: Family Medicine

## 2022-09-12 DIAGNOSIS — G43909 Migraine, unspecified, not intractable, without status migrainosus: Secondary | ICD-10-CM

## 2022-09-12 DIAGNOSIS — F419 Anxiety disorder, unspecified: Secondary | ICD-10-CM

## 2022-09-12 DIAGNOSIS — G8929 Other chronic pain: Secondary | ICD-10-CM

## 2022-09-12 NOTE — Telephone Encounter (Signed)
Requested Prescriptions  Pending Prescriptions Disp Refills   SUMAtriptan (IMITREX) 100 MG tablet [Pharmacy Med Name: SUMAtriptan Succinate 100 MG Oral Tablet] 12 tablet 0    Sig: TAKE 1 TABLET BY MOUTH AT ONSET OF MIGRAINE - MAY REPEAT IN 2 HOURS IF NEEDED - MAX OF 2 TABS IN 24 HRS     Neurology:  Migraine Therapy - Triptan Passed - 09/12/2022  1:14 PM      Passed - Last BP in normal range    BP Readings from Last 1 Encounters:  07/27/22 115/75         Passed - Valid encounter within last 12 months    Recent Outpatient Visits           1 month ago Neck pain   [place], [place], PA-C   1 year ago Migraine without status migrainosus, not intractable, unspecified migraine type   [facility] Jacky Kindle, FNP   1 year ago Positive hepatitis C antibody test   [facility] Merita Norton T, FNP   1 year ago Migraine without status migrainosus, not intractable, unspecified migraine type   [facility] Malva Limes, MD   2 years ago Anxiety   [facility] Flinchum, Eula Fried, FNP

## 2022-09-12 NOTE — Telephone Encounter (Signed)
Requested Prescriptions  Pending Prescriptions Disp Refills   gabapentin (NEURONTIN) 400 MG capsule [Pharmacy Med Name: Gabapentin 400 MG Oral Capsule] 360 capsule 0    Sig: TAKE 1 TO 2 CAPSULES BY MOUTH TWICE DAILY     Neurology: Anticonvulsants - gabapentin Failed - 09/12/2022  1:14 PM      Failed - Completed PHQ-2 or PHQ-9 in the last 360 days      Passed - Cr in normal range and within 360 days    Creatinine, Ser  Date Value Ref Range Status  10/27/2021 0.82 0.44 - 1.00 mg/dL Final         Passed - Valid encounter within last 12 months    Recent Outpatient Visits           1 month ago Neck pain   [facility] [place], [place], PA-C   1 year ago Migraine without status migrainosus, not intractable, unspecified migraine type   [facility] Merita Norton T, FNP   1 year ago Positive hepatitis C antibody test   [facility] Merita Norton T, FNP   1 year ago Migraine without status migrainosus, not intractable, unspecified migraine type   [facility] Malva Limes, MD   2 years ago Anxiety   [facility] Flinchum, Eula Fried, FNP

## 2022-10-13 ENCOUNTER — Telehealth: Payer: Self-pay

## 2022-10-13 ENCOUNTER — Other Ambulatory Visit: Payer: Self-pay | Admitting: Family Medicine

## 2022-10-13 DIAGNOSIS — M545 Low back pain, unspecified: Secondary | ICD-10-CM

## 2022-10-13 DIAGNOSIS — G8929 Other chronic pain: Secondary | ICD-10-CM

## 2022-10-13 DIAGNOSIS — M5442 Lumbago with sciatica, left side: Secondary | ICD-10-CM

## 2022-10-13 DIAGNOSIS — M542 Cervicalgia: Secondary | ICD-10-CM

## 2022-10-13 NOTE — Telephone Encounter (Signed)
Copied from [place] [id number]. Topic: Referral - Request for Referral >> Oct 12, 2022  1:37 PM Marcellus Scott wrote: Has patient seen PCP for this complaint? Yes.   *If NO, is insurance requiring patient see PCP for this issue before PCP can refer them? Referral for which specialty: N/A Preferred provider/office: Gustavo Lah, MD/[facility] Reason for referral: Pt stated she would like a referral sent due to headaches, neck pain, and back pain. Stated she has been seen in the office for this multiple times and has not gotten any relief.

## 2022-10-16 ENCOUNTER — Ambulatory Visit: Payer: Medicaid Other | Admitting: Psychiatry

## 2022-10-23 NOTE — Progress Notes (Signed)
I,Connie R Striblin,acting as a Education administrator for Gwyneth Sprout, FNP.,have documented all relevant documentation on the behalf of Gwyneth Sprout, FNP,as directed by  Gwyneth Sprout, FNP while in the presence of Gwyneth Sprout, FNP.  Established patient visit  Patient: Robin Whitaker   DOB: 1985/10/25   37 y.o. Female  MRN: [id number] Visit Date: 10/24/2022  Today's healthcare provider: Gwyneth Sprout, FNP  Re Introduced to nurse practitioner role and practice setting.  All questions answered.  Discussed provider/patient relationship and expectations.  Chief Complaint  Patient presents with   Follow-up   Subjective    HPI  Follow up for neck and back pain   The patient was last seen for this 3 months ago. Changes made at last visit include OTC pain medications, topical antiinflammatory ointments, wet heat, stretching exercise .  She reports excellent compliance with treatment. She feels that condition is Worse. She is having side effects.  Pt presents today for chronic back and neck pain. Pt states that symptoms are progressing. Complains of fingers going numb and stuttering which have progressed consistently since last ov.  Pt also states that symptoms are worse when back pain is heightened or migraines are present. Pt requesting a referral for pain management.    -----------------------------------------------------------------------------------------   Medications: Outpatient Medications Prior to Visit  Medication Sig   acetaminophen (TYLENOL) 325 MG tablet Take 650 mg by mouth every 6 (six) hours as needed.   gabapentin (NEURONTIN) 400 MG capsule TAKE 1 TO 2 CAPSULES BY MOUTH TWICE DAILY   methocarbamol (ROBAXIN) 500 MG tablet Take 1 tablet (500 mg total) by mouth every 8 (eight) hours as needed for muscle spasms.   SUMAtriptan (IMITREX) 100 MG tablet TAKE 1 TABLET BY MOUTH AT ONSET OF MIGRAINE - MAY REPEAT IN 2 HOURS IF NEEDED - MAX OF 2 TABS IN 24 HRS   No  facility-administered medications prior to visit.    Review of Systems    Objective    BP 102/75 (BP Location: Left Arm, Patient Position: Sitting, Cuff Size: Normal)   Pulse 77   Temp 98.7 F (37.1 C) (Oral)   Wt 118 lb 11.2 oz (53.8 kg)   SpO2 100%   BMI 19.75 kg/m   Physical Exam Vitals and nursing note reviewed.  Constitutional:      General: She is not in acute distress.    Appearance: Normal appearance. She is normal weight. She is not ill-appearing, toxic-appearing or diaphoretic.  HENT:     Head: Normocephalic and atraumatic.  Cardiovascular:     Rate and Rhythm: Normal rate and regular rhythm.     Pulses: Normal pulses.     Heart sounds: Normal heart sounds. No murmur heard.    No friction rub. No gallop.  Pulmonary:     Effort: Pulmonary effort is normal. No respiratory distress.     Breath sounds: Normal breath sounds. No stridor. No wheezing, rhonchi or rales.  Chest:     Chest wall: No tenderness.  Musculoskeletal:        General: Tenderness present. No swelling, deformity or signs of injury. Normal range of motion.     Right lower leg: No edema.     Left lower leg: No edema.     Comments: Generalized neck and back pain  Skin:    General: Skin is warm and dry.     Capillary Refill: Capillary refill takes less than 2 seconds.     Coloration: Skin is  not jaundiced or pale.     Findings: No bruising, erythema, lesion or rash.  Neurological:     General: No focal deficit present.     Mental Status: She is alert and oriented to person, place, and time. Mental status is at baseline.     Cranial Nerves: No cranial nerve deficit.     Sensory: No sensory deficit.     Motor: No weakness.     Coordination: Coordination normal.  Psychiatric:        Mood and Affect: Mood normal.        Behavior: Behavior normal.        Thought Content: Thought content normal.        Judgment: Judgment normal.     No results found for any visits on 10/24/22.  Assessment & Plan      Problem List Items Addressed This Visit       Other   B12 deficiency    Historical, repeat labs given concern for acute on chronic numbness and tingling stemming from back and shoulder pain Not currently on supplementation       Relevant Orders   B12 and Folate Panel   Chronic pain syndrome - Primary    Patient concern for fibromyalgia; reports that she was seen at neuro however, they focused on headaches and concern for rebound issue due to increase in medication use Has a follow up with EMG going forward for further assistance      Relevant Orders   CBC with Differential/Platelet   Comprehensive Metabolic Panel (CMET)   TSH   Family history of diabetes mellitus in mother    Recommend A1c given hx of elevated serum glucose and family hx of DM in mom, T2 Continue to recommend balanced, lower carb meals. Smaller meal size, adding snacks. Choosing water as drink of choice and increasing purposeful exercise.       Relevant Orders   Hemoglobin A1c   Fatigue    Chronic, related to insomnia as well as chronic pain Continue to monitor- rechecked B12 and Vit D      Relevant Orders   TSH   Frequency of urination    Believes urinary frequency and possible concern for incomplete emptying is coming from chronic back and neck pain; wishes to see a specialist to rule out other urinary concerns  Denies symptoms of UTI      Relevant Orders   Ambulatory referral to Urology   Primary insomnia    Chronic, denies anxiety or stress Reports "this has been going on for years" Not improved with elavil, however, has not been consistently taking and is still on 10 mg when she remembers to use       Vitamin D deficiency    Historical, repeat labs given concern for acute on chronic numbness and tingling stemming from back and shoulder pain Not currently on supplementation       Relevant Orders   Vitamin D (25 hydroxy)   Return if symptoms worsen or fail to improve.     Vonna Kotyk, FNP, have reviewed all documentation for this visit. The documentation on 10/24/22 for the exam, diagnosis, procedures, and orders are all accurate and complete.  Gwyneth Sprout, [place] [phone] (phone) [phone] (fax)  [place]

## 2022-10-24 ENCOUNTER — Encounter: Payer: Self-pay | Admitting: Family Medicine

## 2022-10-24 ENCOUNTER — Ambulatory Visit (INDEPENDENT_AMBULATORY_CARE_PROVIDER_SITE_OTHER): Payer: Medicaid Other | Admitting: Family Medicine

## 2022-10-24 VITALS — BP 102/75 | HR 77 | Temp 98.7°F | Wt 118.7 lb

## 2022-10-24 DIAGNOSIS — F5101 Primary insomnia: Secondary | ICD-10-CM | POA: Diagnosis not present

## 2022-10-24 DIAGNOSIS — R350 Frequency of micturition: Secondary | ICD-10-CM | POA: Insufficient documentation

## 2022-10-24 DIAGNOSIS — G4700 Insomnia, unspecified: Secondary | ICD-10-CM | POA: Insufficient documentation

## 2022-10-24 DIAGNOSIS — E559 Vitamin D deficiency, unspecified: Secondary | ICD-10-CM

## 2022-10-24 DIAGNOSIS — E538 Deficiency of other specified B group vitamins: Secondary | ICD-10-CM

## 2022-10-24 DIAGNOSIS — Z833 Family history of diabetes mellitus: Secondary | ICD-10-CM | POA: Insufficient documentation

## 2022-10-24 DIAGNOSIS — G894 Chronic pain syndrome: Secondary | ICD-10-CM | POA: Insufficient documentation

## 2022-10-24 DIAGNOSIS — R5382 Chronic fatigue, unspecified: Secondary | ICD-10-CM | POA: Diagnosis not present

## 2022-10-24 NOTE — Assessment & Plan Note (Signed)
Chronic, related to insomnia as well as chronic pain Continue to monitor- rechecked B12 and Vit D

## 2022-10-24 NOTE — Assessment & Plan Note (Signed)
Chronic, denies anxiety or stress Reports "this has been going on for years" Not improved with elavil, however, has not been consistently taking and is still on 10 mg when she remembers to use

## 2022-10-24 NOTE — Patient Instructions (Addendum)
[place]  [phone]

## 2022-10-24 NOTE — Assessment & Plan Note (Signed)
Believes urinary frequency and possible concern for incomplete emptying is coming from chronic back and neck pain; wishes to see a specialist to rule out other urinary concerns  Denies symptoms of UTI

## 2022-10-24 NOTE — Assessment & Plan Note (Signed)
Historical, repeat labs given concern for acute on chronic numbness and tingling stemming from back and shoulder pain Not currently on supplementation  

## 2022-10-24 NOTE — Assessment & Plan Note (Signed)
Recommend A1c given hx of elevated serum glucose and family hx of DM in mom, T2 Continue to recommend balanced, lower carb meals. Smaller meal size, adding snacks. Choosing water as drink of choice and increasing purposeful exercise.

## 2022-10-24 NOTE — Assessment & Plan Note (Signed)
Patient concern for fibromyalgia; reports that she was seen at neuro however, they focused on headaches and concern for rebound issue due to increase in medication use Has a follow up with EMG going forward for further assistance

## 2022-10-24 NOTE — Assessment & Plan Note (Signed)
Historical, repeat labs given concern for acute on chronic numbness and tingling stemming from back and shoulder pain Not currently on supplementation

## 2022-10-25 ENCOUNTER — Other Ambulatory Visit: Payer: Self-pay | Admitting: Family Medicine

## 2022-10-25 ENCOUNTER — Telehealth: Payer: Self-pay

## 2022-10-25 DIAGNOSIS — D72823 Leukemoid reaction: Secondary | ICD-10-CM | POA: Insufficient documentation

## 2022-10-25 LAB — CBC WITH DIFFERENTIAL/PLATELET
Basophils Absolute: 0 10*3/uL (ref 0.0–0.2)
Basos: 0 %
EOS (ABSOLUTE): 0.3 10*3/uL (ref 0.0–0.4)
Eos: 3 %
Hematocrit: 37.9 % (ref 34.0–46.6)
Hemoglobin: 12.7 g/dL (ref 11.1–15.9)
Immature Grans (Abs): 0 10*3/uL (ref 0.0–0.1)
Immature Granulocytes: 0 %
Lymphocytes Absolute: 2.4 10*3/uL (ref 0.7–3.1)
Lymphs: 22 %
MCH: 31.2 pg (ref 26.6–33.0)
MCHC: 33.5 g/dL (ref 31.5–35.7)
MCV: 93 fL (ref 79–97)
Monocytes Absolute: 0.6 10*3/uL (ref 0.1–0.9)
Monocytes: 5 %
Neutrophils Absolute: 7.4 10*3/uL — ABNORMAL HIGH (ref 1.4–7.0)
Neutrophils: 70 %
Platelets: 330 10*3/uL (ref 150–450)
RBC: 4.07 x10E6/uL (ref 3.77–5.28)
RDW: 11.9 % (ref 11.7–15.4)
WBC: 10.7 10*3/uL (ref 3.4–10.8)

## 2022-10-25 LAB — COMPREHENSIVE METABOLIC PANEL
ALT: 9 IU/L (ref 0–32)
AST: 17 IU/L (ref 0–40)
Albumin/Globulin Ratio: 1.8 (ref 1.2–2.2)
Albumin: 4.4 g/dL (ref 3.9–4.9)
Alkaline Phosphatase: 59 IU/L (ref 44–121)
BUN/Creatinine Ratio: 8 — ABNORMAL LOW (ref 9–23)
BUN: 6 mg/dL (ref 6–20)
Bilirubin Total: 0.4 mg/dL (ref 0.0–1.2)
CO2: 21 mmol/L (ref 20–29)
Calcium: 9.6 mg/dL (ref 8.7–10.2)
Chloride: 106 mmol/L (ref 96–106)
Creatinine, Ser: 0.73 mg/dL (ref 0.57–1.00)
Globulin, Total: 2.5 g/dL (ref 1.5–4.5)
Glucose: 89 mg/dL (ref 70–99)
Potassium: 4.4 mmol/L (ref 3.5–5.2)
Sodium: 141 mmol/L (ref 134–144)
Total Protein: 6.9 g/dL (ref 6.0–8.5)
eGFR: 110 mL/min/{1.73_m2} (ref >59)

## 2022-10-25 LAB — TSH: TSH: 0.761 u[IU]/mL (ref 0.450–4.500)

## 2022-10-25 LAB — B12 AND FOLATE PANEL
Folate: 13.4 ng/mL (ref >3.0)
Vitamin B-12: 421 pg/mL (ref 232–1245)

## 2022-10-25 LAB — VITAMIN D 25 HYDROXY (VIT D DEFICIENCY, FRACTURES): Vit D, 25-Hydroxy: 13.6 ng/mL — ABNORMAL LOW (ref 30.0–100.0)

## 2022-10-25 LAB — HEMOGLOBIN A1C
Est. average glucose Bld gHb Est-mCnc: 103 mg/dL
Hgb A1c MFr Bld: 5.2 % (ref 4.8–5.6)

## 2022-10-25 MED ORDER — VITAMIN D (ERGOCALCIFEROL) 1.25 MG (50000 UNIT) PO CAPS: 50000.0000 [IU] | ORAL_CAPSULE | ORAL | 1 refills | Status: AC

## 2022-10-25 NOTE — Telephone Encounter (Signed)
Called patient back  regarding lab results. Patient expressed understanding and will call with any further questions.

## 2022-10-25 NOTE — Progress Notes (Signed)
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 Recommend follow up with hematology if desired. Continued low vitamin d- recommend high dose supplementation via rx All other labs normal and stable.

## 2022-10-25 NOTE — Telephone Encounter (Signed)
Copied from [place] [phone]. Topic: General - Call Back - No Documentation >> Oct 25, 2022 12:20 PM Cyndi Bender wrote: Reason for CRM: Pt stated she was returning call from a missed call she received earlier today. Pt requests call back.

## 2022-10-27 ENCOUNTER — Encounter: Payer: Self-pay | Admitting: Oncology

## 2022-10-27 ENCOUNTER — Telehealth: Payer: Self-pay

## 2022-10-27 NOTE — Telephone Encounter (Signed)
Call made to new patient. Chart reviewed and updated. Medication reconciliation completed. Referral reason, location directions and appt details given to pt. Visitor and mask policy also addressed with pt. All questions answered.   

## 2022-10-30 ENCOUNTER — Inpatient Hospital Stay: Payer: Medicaid Other | Attending: Oncology | Admitting: Oncology

## 2022-10-30 ENCOUNTER — Inpatient Hospital Stay: Payer: Medicaid Other

## 2022-10-30 VITALS — BP 95/68 | HR 80 | Temp 96.3°F | Resp 14 | Ht 65.0 in | Wt 116.5 lb

## 2022-10-30 DIAGNOSIS — R200 Anesthesia of skin: Secondary | ICD-10-CM | POA: Insufficient documentation

## 2022-10-30 DIAGNOSIS — F17290 Nicotine dependence, other tobacco product, uncomplicated: Secondary | ICD-10-CM | POA: Insufficient documentation

## 2022-10-30 DIAGNOSIS — D72828 Other elevated white blood cell count: Secondary | ICD-10-CM

## 2022-10-30 DIAGNOSIS — Z139 Encounter for screening, unspecified: Secondary | ICD-10-CM

## 2022-10-30 DIAGNOSIS — R519 Headache, unspecified: Secondary | ICD-10-CM | POA: Insufficient documentation

## 2022-10-30 DIAGNOSIS — R5383 Other fatigue: Secondary | ICD-10-CM | POA: Insufficient documentation

## 2022-10-30 DIAGNOSIS — M542 Cervicalgia: Secondary | ICD-10-CM | POA: Insufficient documentation

## 2022-10-30 DIAGNOSIS — D729 Disorder of white blood cells, unspecified: Secondary | ICD-10-CM

## 2022-10-30 LAB — TECHNOLOGIST SMEAR REVIEW
Plt Morphology: NORMAL
RBC MORPHOLOGY: NORMAL
WBC MORPHOLOGY: NORMAL

## 2022-10-30 LAB — CBC WITH DIFFERENTIAL/PLATELET
Abs Immature Granulocytes: 0.02 10*3/uL (ref 0.00–0.07)
Basophils Absolute: 0 10*3/uL (ref 0.0–0.1)
Basophils Relative: 1 %
Eosinophils Absolute: 0.3 10*3/uL (ref 0.0–0.5)
Eosinophils Relative: 5 %
HCT: 41.3 % (ref 36.0–46.0)
Hemoglobin: 13.6 g/dL (ref 12.0–15.0)
Immature Granulocytes: 0 %
Lymphocytes Relative: 25 %
Lymphs Abs: 1.6 10*3/uL (ref 0.7–4.0)
MCH: 31.2 pg (ref 26.0–34.0)
MCHC: 32.9 g/dL (ref 30.0–36.0)
MCV: 94.7 fL (ref 80.0–100.0)
Monocytes Absolute: 0.4 10*3/uL (ref 0.1–1.0)
Monocytes Relative: 6 %
Neutro Abs: 4 10*3/uL (ref 1.7–7.7)
Neutrophils Relative %: 63 %
Platelets: 318 10*3/uL (ref 150–400)
RBC: 4.36 MIL/uL (ref 3.87–5.11)
RDW: 12.8 % (ref 11.5–15.5)
WBC: 6.4 10*3/uL (ref 4.0–10.5)
nRBC: 0 % (ref 0.0–0.2)

## 2022-10-30 LAB — LACTATE DEHYDROGENASE: LDH: 92 U/L — ABNORMAL LOW (ref 98–192)

## 2022-10-30 NOTE — Progress Notes (Signed)
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  Pt reports having pain in neck and upper back.

## 2022-10-30 NOTE — Assessment & Plan Note (Addendum)
Likely reactive.  Check cbc , smear, flowcytometry, LDH.   I discussed with patient that if above work up is negative no need to follow up actively [place] hematology.  I recommend patient to further discuss with pcp regarding further work up and evaluation of her multiple symptoms.

## 2022-10-30 NOTE — Progress Notes (Signed)
Hematology/Oncology Consult note [phone] [phone]         Patient Care Team: Gwyneth Sprout, FNP as PCP - General (Family Medicine)  REFERRING PROVIDER: Gwyneth Sprout, FNP   CHIEF COMPLAINTS/REASON FOR VISIT:  Evaluation of   ASSESSMENT & PLAN:   Neutrophilia Likely reactive.  Check cbc , smear, flowcytometry, LDH.   I discussed with patient that if above work up is negative no need to follow up actively [facility].  I recommend patient to further discuss with pcp regarding further work up and evaluation of her multiple symptoms.    Orders Placed This Encounter  Procedures   CBC with Differential/Platelet    Standing Status:   Future    Number of Occurrences:   1    Standing Expiration Date:   10/31/2023   Flow cytometry panel-leukemia/lymphoma work-up    Standing Status:   Future    Number of Occurrences:   1    Standing Expiration Date:   10/31/2023   Lactate dehydrogenase    Standing Status:   Future    Number of Occurrences:   1    Standing Expiration Date:   10/31/2023   Technologist smear review    Standing Status:   Future    Number of Occurrences:   1    Standing Expiration Date:   10/31/2023    Order Specific Question:   Clinical information:    Answer:   neutrophillia   Ambulatory referral to Social Work    Referral Priority:   Routine    Referral Type:   Consultation    Referral Reason:   Specialty Services Required    Number of Visits Requested:   1    All questions were answered. The patient knows to call the clinic with any problems, questions or concerns.  Earlie Server, MD, PhD [facility] Hematology Oncology 10/30/2022    HISTORY OF PRESENTING ILLNESS:   Robin Whitaker is a  37 y.o.  female with PMH listed below was seen in consultation at the request of  Gwyneth Sprout, FNP  for evaluation of stress neutrophilia.  She has multiple complaints today.  + feeling fatigue, faint, vertigo like feelings, neck pain,  hands are shaky, numbness and headache. She feels that she " a million things are going on".  + bladder is not emptying. She does not sleep well. Se feels that her symptoms have distracted her from normal life.  She has been evaluated by neurology Dr.Potters.  Her PCP has referred her to see urology.  Recent cbc showed normal white count, mild neutrophilia.  Family history is positive for brother with acute leukemia, requiting stem cell transplant.  Denies weight loss, fever, chills,  night sweats.     MEDICAL HISTORY:  Past Medical History:  Diagnosis Date   Allergy    Anxiety    Chronic back pain    Migraine     SURGICAL HISTORY: Past Surgical History:  Procedure Laterality Date   APPENDECTOMY     WISDOM TOOTH EXTRACTION      SOCIAL HISTORY: Social History   Socioeconomic History   Marital status: Single    Spouse name: Not on file   Number of children: Not on file   Years of education: Not on file   Highest education level: Not on file  Occupational History   Not on file  Tobacco Use   Smoking status: Every Day    Types: E-cigarettes   Smokeless tobacco: Current  Vaping Use   Vaping Use: Every day  Substance and Sexual Activity   Alcohol use: Not Currently   Drug use: Never   Sexual activity: Not Currently    Partners: Male    Birth control/protection: None  Other Topics Concern   Not on file  Social History Narrative   Not on file   Social Determinants of Health   Financial Resource Strain: Not on file  Food Insecurity: Food Insecurity Present (10/30/2022)   Hunger Vital Sign    Worried About Running Out of Food in the Last Year: Often true    Ran Out of Food in the Last Year: Sometimes true  Transportation Needs: No Transportation Needs (10/30/2022)   PRAPARE - Administrator, Civil Service (Medical): No    Lack of Transportation (Non-Medical): No  Physical Activity: Not on file  Stress: Not on file  Social Connections: Not on file   Intimate Partner Violence: Not At Risk (10/30/2022)   Humiliation, Afraid, Rape, and Kick questionnaire    Fear of Current or Ex-Partner: No    Emotionally Abused: No    Physically Abused: No    Sexually Abused: No    FAMILY HISTORY: Family History  Problem Relation Age of Onset   Hypertension Mother    Diabetes Mother    Hypertension Father    Leukemia Brother    Arthritis Maternal Grandmother    Anuerysm Maternal Grandfather    Congestive Heart Failure Paternal Grandmother     ALLERGIES:  is allergic to cortisone, ibuprofen, and other.  MEDICATIONS:  Current Outpatient Medications  Medication Sig Dispense Refill   acetaminophen (TYLENOL) 325 MG tablet Take 650 mg by mouth every 6 (six) hours as needed.     gabapentin (NEURONTIN) 400 MG capsule TAKE 1 TO 2 CAPSULES BY MOUTH TWICE DAILY 360 capsule 0   nortriptyline (PAMELOR) 10 MG capsule Take 10 mg by mouth daily.     SUMAtriptan (IMITREX) 100 MG tablet TAKE 1 TABLET BY MOUTH AT ONSET OF MIGRAINE - MAY REPEAT IN 2 HOURS IF NEEDED - MAX OF 2 TABS IN 24 HRS 12 tablet 0   Vitamin D, Ergocalciferol, (DRISDOL) 1.25 MG (50000 UNIT) CAPS capsule Take 1 capsule (50,000 Units total) by mouth every 7 (seven) days. 26 capsule 1   No current facility-administered medications for this visit.    Review of Systems  Constitutional:  Positive for fatigue. Negative for appetite change, chills, fever and unexpected weight change.  HENT:   Negative for hearing loss and voice change.   Eyes:  Negative for eye problems.  Respiratory:  Negative for chest tightness and cough.   Cardiovascular:  Negative for chest pain.  Gastrointestinal:  Negative for abdominal distention, abdominal pain and blood in stool.  Endocrine: Negative for hot flashes.  Genitourinary:  Negative for difficulty urinating and frequency.        Difficulty in empty bladder  Musculoskeletal:  Positive for neck pain. Negative for arthralgias.  Skin:  Negative for itching  and rash.  Neurological:  Positive for headaches and numbness. Negative for extremity weakness.       Shaky hands  Hematological:  Negative for adenopathy.  Psychiatric/Behavioral:  Negative for confusion.    PHYSICAL EXAMINATION: Vitals:   10/30/22 1121  BP: 95/68  Pulse: 80  Resp: 14  Temp: (!) 96.3 F (35.7 C)  SpO2: 100%   Filed Weights   10/30/22 1120  Weight: 116 lb 8 oz (52.8 kg)    Physical  Exam Constitutional:      General: She is not in acute distress. HENT:     Head: Normocephalic and atraumatic.  Eyes:     General: No scleral icterus. Cardiovascular:     Rate and Rhythm: Normal rate and regular rhythm.     Heart sounds: Normal heart sounds.  Pulmonary:     Effort: Pulmonary effort is normal. No respiratory distress.     Breath sounds: No wheezing.  Abdominal:     General: Bowel sounds are normal. There is no distension.     Palpations: Abdomen is soft.  Musculoskeletal:        General: No deformity. Normal range of motion.     Cervical back: Normal range of motion and neck supple.  Skin:    General: Skin is warm and dry.     Findings: No erythema or rash.  Neurological:     Mental Status: She is alert and oriented to person, place, and time. Mental status is at baseline.     Cranial Nerves: No cranial nerve deficit.     Coordination: Coordination normal.  Psychiatric:     Comments: anxious     LABORATORY DATA:  I have reviewed the data as listed    Latest Ref Rng & Units 10/30/2022   11:50 AM 10/24/2022    1:49 PM 06/28/2021   11:50 AM  CBC  WBC 4.0 - 10.5 K/uL 6.4  10.7  6.6   Hemoglobin 12.0 - 15.0 g/dL 13.6  12.7  15.3   Hematocrit 36.0 - 46.0 % 41.3  37.9  45.0   Platelets 150 - 400 K/uL 318  330  262       Latest Ref Rng & Units 10/24/2022    1:49 PM 10/27/2021   12:43 PM 06/28/2021   11:50 AM  CMP  Glucose 70 - 99 mg/dL 89  79  91   BUN 6 - 20 mg/dL 6  17  10    Creatinine 0.57 - 1.00 mg/dL 0.73  0.82  0.72   Sodium 134 - 144 mmol/L  141  139  138   Potassium 3.5 - 5.2 mmol/L 4.4  4.3  4.3   Chloride 96 - 106 mmol/L 106  105  99   CO2 20 - 29 mmol/L 21  28  28    Calcium 8.7 - 10.2 mg/dL 9.6  9.3  9.9   Total Protein 6.0 - 8.5 g/dL 6.9  7.1  8.6   Total Bilirubin 0.0 - 1.2 mg/dL 0.4  0.4  0.9   Alkaline Phos 44 - 121 IU/L 59  53  63   AST 0 - 40 IU/L 17  17  31    ALT 0 - 32 IU/L 9  12  42       RADIOGRAPHIC STUDIES: I have personally reviewed the radiological images as listed and agreed with the findings in the report. No results found.

## 2022-11-01 ENCOUNTER — Encounter: Payer: Self-pay | Admitting: Oncology

## 2022-11-01 ENCOUNTER — Inpatient Hospital Stay: Payer: Medicaid Other | Admitting: Licensed Clinical Social Worker

## 2022-11-01 DIAGNOSIS — D729 Disorder of white blood cells, unspecified: Secondary | ICD-10-CM

## 2022-11-01 LAB — COMP PANEL: LEUKEMIA/LYMPHOMA

## 2022-11-01 NOTE — Telephone Encounter (Signed)
Just FYI- Flow cyt still pending.

## 2022-11-01 NOTE — Progress Notes (Signed)
[facility]  Initial Assessment   Ignacia Gentzler is a 37 y.o. year old female contacted by phone. Clinical Social Work was referred by medical provider for assessment of psychosocial needs.   SDOH (Social Determinants of Health) assessments performed: Yes SDOH Interventions    Flowsheet Row Office Visit from 10/30/2022 in [place] [facility] [place] Interventions   Food Insecurity Interventions AMB Referral  Housing Interventions AMB Referral  Utilities Interventions AMB Referral       SDOH Screenings   Food Insecurity: Food Insecurity Present (10/30/2022)  Housing: Medium Risk (10/30/2022)  Transportation Needs: No Transportation Needs (10/30/2022)  Utilities: At Risk (10/30/2022)  Alcohol Screen: Low Risk  (02/11/2021)  Depression (PHQ2-9): Low Risk  (10/30/2022)  Tobacco Use: High Risk (10/27/2022)     Distress Screen completed: Yes    10/27/2022    1:47 PM  ONCBCN DISTRESS SCREENING  Screening Type Initial Screening  Distress experienced in past week (1-10) 0      Family/Social Information:  Housing Arrangement: patient lives with minor son, Melanni, [place] (Mother) [phone]  main contact Family members/support persons in your life? Family, Friends, School, and Geophysical data processor concerns: no  Employment: Working full time Pharmacist, hospital.  Income source: Employment Financial concerns:  yes, patient stated she is receiving financial assistance from the state Type of concern:  no immediate concerns identified Food access concerns: no Religious or spiritual practice: Not known Services Currently in place:  Medicaid/[employer]  Coping/ Adjustment to diagnosis: Patient understands treatment plan and what happens next? yes Concerns about diagnosis and/or treatment: Losing my job and/or losing income and How I will pay for the services I need Patient reported stressors: Work/ school, Publishing rights manager, and Childcare/ elder care Hopes and/or  priorities: N/A Patient enjoys: time with son Current coping skills/ strengths: Average or above average intelligence , Capable of independent living , Armed forces logistics/support/administrative officer , Solicitor fund of knowledge , Motivation for treatment/growth , Physical Health , Special hobby/interest , Supportive family/friends , and Work skills     SUMMARY: Current SDOH Barriers:  Financial constraints related to fixed income and Limited social support  Clinical Social Work Clinical Goal(s):  No clinical social work goals at this time  Interventions: Discussed common feeling and emotions when being diagnosed with cancer, and the importance of support during treatment Informed patient of the support team roles and support services at [facility] Provided [place] contact information and encouraged patient to call with any questions or concerns Provided patient with information about CSW role in patient care and other available resources.  Patient stated she does not have immediate needs.    Follow Up Plan: Patient will contact CSW with any support or resource needs Patient verbalizes understanding of plan: Yes    Adelene Amas, LCSW   Patient is participating in a Managed Medicaid Plan:  Yes

## 2022-11-13 ENCOUNTER — Other Ambulatory Visit: Payer: Self-pay | Admitting: Neurology

## 2022-11-13 DIAGNOSIS — R200 Anesthesia of skin: Secondary | ICD-10-CM

## 2022-11-13 DIAGNOSIS — F8081 Childhood onset fluency disorder: Secondary | ICD-10-CM

## 2022-11-13 DIAGNOSIS — R519 Headache, unspecified: Secondary | ICD-10-CM

## 2022-11-15 ENCOUNTER — Encounter: Payer: Self-pay | Admitting: Psychiatry

## 2022-11-15 ENCOUNTER — Ambulatory Visit (INDEPENDENT_AMBULATORY_CARE_PROVIDER_SITE_OTHER): Payer: Medicaid Other | Admitting: Psychiatry

## 2022-11-15 VITALS — BP 109/75 | HR 83 | Temp 98.5°F | Ht 65.0 in | Wt 117.2 lb

## 2022-11-15 DIAGNOSIS — F419 Anxiety disorder, unspecified: Secondary | ICD-10-CM | POA: Diagnosis not present

## 2022-11-15 DIAGNOSIS — F451 Undifferentiated somatoform disorder: Secondary | ICD-10-CM | POA: Diagnosis not present

## 2022-11-15 DIAGNOSIS — F459 Somatoform disorder, unspecified: Secondary | ICD-10-CM | POA: Insufficient documentation

## 2022-11-15 DIAGNOSIS — G4700 Insomnia, unspecified: Secondary | ICD-10-CM | POA: Diagnosis not present

## 2022-11-15 MED ORDER — MIRTAZAPINE 7.5 MG PO TABS: 7.5000 mg | ORAL_TABLET | Freq: Every evening | ORAL | 0 refills | Status: DC | PRN

## 2022-11-15 NOTE — Patient Instructions (Signed)
[email or web address]  [email or web address]  [email or web address] [address] [place], [place] [address]   [phone]  [facility], [email or web address] [address] [place], [place] [address]  [phone]   [facility] - [id number]  [facility] - [id number]  [facility] - [place] [phone]  [facility] - [phone]   Mirtazapine Tablets What is this medication? MIRTAZAPINE (mir TAZ a peen) treats depression. It increases the amount of serotonin and norepinephrine in the brain, hormones that help regulate mood. This medicine may be used for other purposes; ask your health care provider or pharmacist if you have questions. COMMON BRAND NAME(S): Remeron What should I tell my care team before I take this medication? They need to know if you have any of these conditions: Bipolar disorder Glaucoma Kidney disease Liver disease Suicidal thoughts An unusual or allergic reaction to mirtazapine, other medications, foods, dyes, or preservatives Pregnant or trying to get pregnant Breast-feeding How should I use this medication? Take this medication by mouth with a glass of water. Follow the directions on the prescription label. Take your medication at regular intervals. Do not take your medication more often than directed. Do not stop taking this medication suddenly except upon the advice of your care team. Stopping this medication too quickly may cause serious side effects or your condition may worsen. A special MedGuide will be given to you by the pharmacist with each prescription and refill. Be sure to read this information carefully each time. Talk to your care team about the use of this medication in children. Special care may be needed. Overdosage: If you think you have taken too much of this medicine contact a poison control center or  emergency room at once. NOTE: This medicine is only for you. Do not share this medicine with others. What if I miss a dose? If you miss a dose, take it as soon as you can. If it is almost time for your next dose, take only that dose. Do not take double or extra doses. What may interact with this medication? Do not take this medication with any of the following: Linezolid MAOIs, such as Carbex, Eldepryl, Marplan, Nardil, and Parnate Methylene blue (injected into a vein) This medication may also interact with the following: Alcohol Antivirals for HIV or AIDS Certain medications that treat or prevent blood clots, such as warfarin Certain medications for fungal infections, such as ketoconazole and itraconazole Certain medications for mental health conditions Certain medications for migraine headache, such as almotriptan, eletriptan, frovatriptan, naratriptan, rizatriptan, sumatriptan, zolmitriptan Certain medications for seizures, such as carbamazepine or phenytoin Certain medications for sleep Cimetidine Erythromycin Fentanyl Lithium Medications for blood pressure Nefazodone Rasagiline Rifampin Supplements, such as St. John's wort, kava kava, valerian Tramadol Tryptophan This list may not describe all possible interactions. Give your health care provider a list of all the medicines, herbs, non-prescription drugs, or dietary supplements you use. Also tell them if you smoke, drink alcohol, or use illegal drugs. Some items may interact with your medicine. What should I watch for while using this medication? Visit your care team for regular checks on your progress. It may be some time before you see the benefit from this medication. This medication may cause thoughts of suicide or depression. This includes sudden changes in mood, behaviors, or thoughts. These changes can happen at any time but are more common in the beginning of treatment or after  a change in dose. Call your care team right  away if you experience these thoughts or worsening depression. This medication may affect your coordination, reaction time, or judgment. Do not drive or operate machinery until you know how this medication affects you. Sit up or stand slowly to reduce the risk of dizzy or fainting spells. Drinking alcohol with this medication can increase the risk of these side effects. This medication may cause dry eyes and blurred vision. If you wear contact lenses, you may feel some discomfort. Lubricating eye drops may help. See your care team if the problem does not go away or is severe. Your mouth may get dry. Chewing sugarless gum or sucking hard candy and drinking plenty of water may help. Contact your care team if the problem does not go away or is severe. What side effects may I notice from receiving this medication? Side effects that you should report to your care team as soon as possible: Allergic reactions--skin rash, itching, hives, swelling of the face, lips, tongue, or throat Heart rhythm changes--fast or irregular heartbeat, dizziness, feeling faint or lightheaded, chest pain, trouble breathing Infection--fever, chills, cough, or sore throat Irritability, confusion, fast or irregular heartbeat, muscle stiffness, twitching muscles, sweating, high fever, seizure, chills, vomiting, diarrhea, which may be signs of serotonin syndrome Low sodium level--muscle weakness, fatigue, dizziness, headache, confusion Rash, fever, and swollen lymph nodes Redness, blistering, peeling or loosening of the skin, including inside the mouth Seizures Sudden eye pain or change in vision such as blurry vision, seeing halos around lights, vision loss Thoughts of suicide or self-harm, worsening mood, feelings of depression Side effects that usually do not require medical attention (report to your care team if they continue or are bothersome): Constipation Dizziness Drowsiness Dry mouth Increase in appetite Weight  gain This list may not describe all possible side effects. Call your doctor for medical advice about side effects. You may report side effects to FDA at 1-800-FDA-1088. Where should I keep my medication? Keep out of the reach of children. Store at room temperature between 15 and 30 degrees C (59 and 86 degrees F) Protect from light and moisture. Throw away any unused medication after the expiration date. NOTE: This sheet is a summary. It may not cover all possible information. If you have questions about this medicine, talk to your doctor, pharmacist, or health care provider.  2023 Elsevier/Gold Standard (2007-11-09 00:00:00)
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
## 2022-11-20 ENCOUNTER — Ambulatory Visit
Admission: RE | Admit: 2022-11-20 | Discharge: 2022-11-20 | Disposition: A | Discharge status: Home / Self Care | Payer: Medicaid Other | Source: Ambulatory Visit | Attending: Neurology | Admitting: Neurology

## 2022-11-20 DIAGNOSIS — R200 Anesthesia of skin: Secondary | ICD-10-CM | POA: Diagnosis present

## 2022-11-20 DIAGNOSIS — R202 Paresthesia of skin: Secondary | ICD-10-CM | POA: Diagnosis present

## 2022-11-20 DIAGNOSIS — R519 Headache, unspecified: Secondary | ICD-10-CM

## 2022-11-20 DIAGNOSIS — F8081 Childhood onset fluency disorder: Secondary | ICD-10-CM

## 2022-11-23 ENCOUNTER — Ambulatory Visit (INDEPENDENT_AMBULATORY_CARE_PROVIDER_SITE_OTHER): Payer: Medicaid Other | Admitting: Urology

## 2022-11-23 ENCOUNTER — Encounter: Payer: Self-pay | Admitting: Urology

## 2022-11-23 VITALS — BP 99/65 | HR 73 | Ht 65.0 in | Wt 120.0 lb

## 2022-11-23 DIAGNOSIS — R3915 Urgency of urination: Secondary | ICD-10-CM | POA: Diagnosis not present

## 2022-11-23 DIAGNOSIS — R3916 Straining to void: Secondary | ICD-10-CM | POA: Diagnosis not present

## 2022-11-23 DIAGNOSIS — R350 Frequency of micturition: Secondary | ICD-10-CM

## 2022-11-23 DIAGNOSIS — N3943 Post-void dribbling: Secondary | ICD-10-CM

## 2022-11-23 LAB — BLADDER SCAN AMB NON-IMAGING

## 2022-11-23 LAB — URINALYSIS, COMPLETE
Bilirubin, UA: NEGATIVE
Glucose, UA: NEGATIVE
Ketones, UA: NEGATIVE
Leukocytes,UA: NEGATIVE
Nitrite, UA: NEGATIVE
Protein,UA: NEGATIVE
Specific Gravity, UA: 1.015 (ref 1.005–1.030)
Urobilinogen, Ur: 0.2 mg/dL (ref 0.2–1.0)
pH, UA: 5.5 (ref 5.0–7.5)

## 2022-11-23 LAB — MICROSCOPIC EXAMINATION

## 2022-11-23 MED ORDER — GEMTESA 75 MG PO TABS: 75.0000 mg | ORAL_TABLET | Freq: Every day | ORAL | 0 refills | Status: AC

## 2022-11-23 NOTE — Addendum Note (Signed)
Addended by: Donalee Citrin on: 11/23/2022 10:50 AM   Modules accepted: Orders

## 2022-11-23 NOTE — Progress Notes (Signed)
   11/23/22 10:41 AM   Robin Whitaker 03/10/1986 [id number]  CC: Urinary urgency/frequency, urinary dribbling  HPI: I saw Robin Whitaker today for the above issues-she is a 37 year old female with a number of comorbidities and extensive psychiatric history and anxiety, currently undergoing workup with neurology for a number of symptoms including full body pain, migraines.  Her primary urinary complaints are at least a few years of urinary frequency with 15 voids per day, 0-2 times overnight, persistent sensation to void, and then 3 months of recent urinary dribbling and sensation that she has to push to empty.  She denies any recurrent UTIs or gross hematuria.  She drinks coffee and tea during the day.  Urinalysis today is pending, PVR is normal at 82m.  She had a recent brain MRI on 11/20/2022 that was normal.   PMH: Past Medical History:  Diagnosis Date   Allergy    Anxiety    Chronic back pain    Hepatitis C    Migraine     Surgical History: Past Surgical History:  Procedure Laterality Date   APPENDECTOMY     WISDOM TOOTH EXTRACTION      Family History: Family History  Problem Relation Age of Onset   Alcohol abuse Mother    ADD / ADHD Mother    Anxiety disorder Mother    Hypertension Mother    Diabetes Mother    Hypertension Father    Leukemia Brother    Anuerysm Maternal Grandfather    Arthritis Maternal Grandmother    Congestive Heart Failure Paternal Grandmother     Social History:  reports that she has been smoking e-cigarettes. She has been exposed to tobacco smoke. She uses smokeless tobacco. She reports that she does not currently use alcohol. She reports current drug use. Drug: Marijuana.  Physical Exam: BP 99/65   Pulse 73   Ht 5' 5"$  (1.651 m)   Wt 120 lb (54.4 kg)   BMI 19.97 kg/m    Constitutional:  Alert and oriented, No acute distress. Cardiovascular: No clubbing, cyanosis, or edema. Respiratory: Normal respiratory effort, no increased work of  breathing. GI: Abdomen is soft, nontender, nondistended, no abdominal masses   Laboratory Data: Reviewed, see HPI  Pertinent Imaging: I have personally viewed and interpreted the CT dated 01/12/2021 showing no hydronephrosis or other significant abnormality, punctate nonobstructive left midpole stone.  Assessment & Plan:   37year old female with very long history of many years of urinary frequency and urgency, new symptoms over the last 3 months of urinary dribbling and sensation of pushing to void.  PVR is normal at 5 mL.  Brain MRI normal, CT normal from April 2022.  We reviewed possible etiologies including overactive bladder, interstitial cystitis, nephrolithiasis, pelvic floor dysfunction.  We reviewed the impact of anxiety on bladder symptoms.  -I think it is reasonable to trial Gemtesa for her overactive symptoms, and risks and benefits were discussed.  We also reviewed behavioral strategies including avoiding bladder irritants. -Will call with UA results, consider renal ultrasound if microscopic hematuria -RTC 6 weeks symptom check on the [facility]aco PVR  BNickolas Madrid MD 11/23/2022  [place] [place] [phone]

## 2022-11-23 NOTE — Patient Instructions (Signed)
Pelvic Floor Dysfunction, Female  Pelvic floor dysfunction (PFD) is a condition that results when the group of muscles and connective tissues that support the organs in the pelvis (pelvic floor muscles) do not work well. These muscles and their connections form a sling that supports the colon and bladder. In women, they also support the uterus. PFD causes pelvic floor muscles to be too weak, too tight, or both. In PFD, muscle movements are not coordinated. This may cause bowel or bladder problems. It may also cause pain. What are the causes? This condition may be caused by an injury to the pelvic area or by a weakening of pelvic muscles. This often results from pregnancy and childbirth or other types of strain. In many cases, the exact cause is not known. What increases the risk? The following factors may make you more likely to develop this condition: Having chronic bladder tissue inflammation (interstitial cystitis). Being an older person. Being overweight. History of radiation treatment for cancer in the pelvic region. Previous pelvic surgery, such as removal of the uterus (hysterectomy). What are the signs or symptoms? Symptoms of this condition vary and may include: Bladder symptoms, such as: Trouble starting urination and emptying the bladder. Frequent urinary tract infections. Leaking urine when coughing, laughing, or exercising (stress incontinence). Having to pass urine urgently or frequently. Pain when passing urine. Bowel symptoms, such as: Constipation. Urgent or frequent bowel movements. Incomplete bowel movements. Painful bowel movements. Leaking stool or gas. Unexplained genital or rectal pain. Genital or rectal muscle spasms. Low back pain. Other symptoms may include: A heavy, full, or aching feeling in the vagina. A bulge that protrudes into the vagina. Pain during or after sex. How is this diagnosed? This condition may be diagnosed based on: Your symptoms and  medical history. A physical exam. During the exam, your health care provider may check your pelvic muscles for tightness, spasm, pain, or weakness. This may include a rectal exam and a pelvic exam. In some cases, you may have diagnostic tests, such as: Electrical muscle function tests. Urine flow testing. X-ray tests of bowel function. Ultrasound of the pelvic organs. How is this treated? Treatment for this condition depends on the symptoms. Treatment options include: Physical therapy. This may include Kegel exercises to help relax or strengthen the pelvic floor muscles. Biofeedback. This type of therapy provides feedback on how tight your pelvic floor muscles are so that you can learn to control them. Internal or external massage therapy. A treatment that involves electrical stimulation of the pelvic floor muscles to help control pain (transcutaneous electrical nerve stimulation, or TENS). Sound wave therapy (ultrasound) to reduce muscle spasms. Medicines, such as: Muscle relaxants. Bladder control medicines. Surgery to reconstruct or support pelvic floor muscles may be an option if other treatments do not help. Follow these instructions at home: Activity Do your usual activities as told by your health care provider. Ask your health care provider if you should modify any activities. Do pelvic floor strengthening or relaxing exercises at home as told by your physical therapist. Lifestyle Maintain a healthy weight. Eat foods that are high in fiber, such as beans, whole grains, and fresh fruits and vegetables. Limit foods that are high in fat and processed sugars, such as fried or sweet foods. Manage stress with relaxation techniques such as yoga or meditation. General instructions If you have problems with leakage: Use absorbable pads or wear padded underwear. Wash frequently with mild soap. Keep your genital and anal area as clean and dry as  possible. Ask your health care provider if  you should try a barrier cream to prevent skin irritation. Take warm baths to relieve pelvic muscle tension or spasms. Take over-the-counter and prescription medicines only as told by your health care provider. Keep all follow-up visits. How is this prevented? The cause of PFD is not always known, but there are a few things you can do to reduce the risk of developing this condition, including: Staying at a healthy weight. Getting regular exercise. Managing stress. Contact a health care provider if: Your symptoms are not improving with home care. You have signs or symptoms of PFD that get worse at home. You develop new signs or symptoms. You have signs of a urinary tract infection, such as: Fever. Chills. Increased urinary frequency. A burning feeling when urinating. You have not had a bowel movement in 3 days (constipation). Summary Pelvic floor dysfunction results when the muscles and connective tissues in your pelvic floor do not work well. These muscles and their connections form a sling that supports your colon and bladder. In women, they also support the uterus. PFD may be caused by an injury to the pelvic area or by a weakening of pelvic muscles. PFD causes pelvic floor muscles to be too weak, too tight, or a combination of both. Symptoms may vary from person to person. In most cases, PFD can be treated with physical therapies and medicines. Surgery may be an option if other treatments do not help. This information is not intended to replace advice given to you by your health care provider. Make sure you discuss any questions you have with your health care provider. Document Revised: 01/26/2021 Document Reviewed: 01/26/2021 Elsevier Patient Education  [place].

## 2022-11-24 ENCOUNTER — Telehealth: Payer: Self-pay

## 2022-11-24 DIAGNOSIS — R3129 Other microscopic hematuria: Secondary | ICD-10-CM

## 2022-11-24 NOTE — Telephone Encounter (Signed)
-----   Message from Billey Co, MD sent at 11/23/2022  4:10 PM EST ----- Very small amount of microscopic blood on urine test, recommend renal ultrasound, will call with results  Nickolas Madrid, MD 11/23/2022

## 2022-11-24 NOTE — Telephone Encounter (Signed)
Called pt, informed her of the information below. Pt voiced understanding. U/S ordered.

## 2022-12-06 ENCOUNTER — Ambulatory Visit
Admission: RE | Admit: 2022-12-06 | Discharge: 2022-12-06 | Disposition: A | Discharge status: Home / Self Care | Payer: Medicaid Other | Source: Ambulatory Visit | Attending: Urology | Admitting: Urology

## 2022-12-06 DIAGNOSIS — R3129 Other microscopic hematuria: Secondary | ICD-10-CM

## 2022-12-13 ENCOUNTER — Telehealth: Payer: Self-pay

## 2022-12-13 NOTE — Telephone Encounter (Signed)
Incoming call from pt on triage line who states she would like the results of her renal scan as she is having flank pain. Informed pt that she was informed of her results which were normal via mychart and advised pt that I can see she viewed results. Pt questions if her renal scan showed a kidney stone. Advised pt there were no stones on her scan and it would be highly unlikely that she developed a stone in a week. Advised pt that flank pain may be musculoskeletal in nature. Patient adamant is that her pain is not muscular. Advised pt that unfortunately there is not urologic cause for her discomfort. She denies dysuria, fever, chills, or hematuria. Advised pt f/u with PCP for possible causes of back pain.

## 2022-12-19 ENCOUNTER — Encounter: Payer: Self-pay | Admitting: Family Medicine

## 2022-12-19 ENCOUNTER — Ambulatory Visit (INDEPENDENT_AMBULATORY_CARE_PROVIDER_SITE_OTHER): Payer: Medicaid Other | Admitting: Family Medicine

## 2022-12-19 VITALS — BP 95/66 | HR 79 | Temp 98.9°F | Resp 14 | Ht 65.0 in | Wt 120.7 lb

## 2022-12-19 DIAGNOSIS — G894 Chronic pain syndrome: Secondary | ICD-10-CM

## 2022-12-19 DIAGNOSIS — N920 Excessive and frequent menstruation with regular cycle: Secondary | ICD-10-CM

## 2022-12-19 DIAGNOSIS — K5909 Other constipation: Secondary | ICD-10-CM | POA: Diagnosis not present

## 2022-12-19 MED ORDER — DULOXETINE HCL 30 MG PO CPEP: 30.0000 mg | ORAL_CAPSULE | Freq: Two times a day (BID) | ORAL | 3 refills | Status: AC

## 2022-12-19 NOTE — Progress Notes (Unsigned)
I,J'ya E Xiomar Crompton,acting as a scribe for Gwyneth Sprout, FNP.,have documented all relevant documentation on the behalf of Gwyneth Sprout, FNP,as directed by  Gwyneth Sprout, FNP while in the presence of Gwyneth Sprout, FNP.  Established patient visit  Patient: Robin Whitaker   DOB: Mar 01, 1986   37 y.o. Female  MRN: [id number] Visit Date: 12/19/2022  Today's healthcare provider: Gwyneth Sprout, FNP  Re Introduced to nurse practitioner role and practice setting.  All questions answered.  Discussed provider/patient relationship and expectations.  Subjective    HPI  Back Pain: Patient presents for presents evaluation of low back problems.  Symptoms have been present for 1 year and include numbness in fingers and toes, pain in lower back (aching, burning, numbing, pulsating, shooting, and tingling in character; 9/10 in severity), stiffness in neck, upper and lower back and hips, and weakness in neck and hips . Initial inciting event: none. Symptoms are worst: variable. Alleviating factors identifiable by patient are medication Tylenol and Gabapenatin . Exacerbating factors identifiable by patient are none. Treatments so far initiated by patient:  PT, stretching, massage, chiro with TENS  Previous lower back problems: none. Previous workup:  not revealing . Previous treatments:  none . Patient states that the pain was also internal and it felt as though she had "kidney stones". Patient says she went to the urologist 1 month ago and results came back negative. Her visit today is due to chronic pain that worsened.    Medications: Outpatient Medications Prior to Visit  Medication Sig   clonazePAM (KLONOPIN) 0.5 MG tablet TAKE 0.25 MG DAILY FOR 1 WEEK, THEN INCREASE TO 0.25 MG TWICE A DAY   gabapentin (NEURONTIN) 400 MG capsule TAKE 1 TO 2 CAPSULES BY MOUTH TWICE DAILY   SUMAtriptan (IMITREX) 100 MG tablet TAKE 1 TABLET BY MOUTH AT ONSET OF MIGRAINE - MAY REPEAT IN 2 HOURS IF NEEDED - MAX OF 2 TABS IN 24 HRS    Vibegron (GEMTESA) 75 MG TABS Take 1 tablet (75 mg total) by mouth daily.   Vitamin D, Ergocalciferol, (DRISDOL) 1.25 MG (50000 UNIT) CAPS capsule Take 1 capsule (50,000 Units total) by mouth every 7 (seven) days.   [DISCONTINUED] mirtazapine (REMERON) 7.5 MG tablet Take 1 tablet (7.5 mg total) by mouth at bedtime as needed. (Patient not taking: Reported on 12/19/2022)   No facility-administered medications prior to visit.    Review of Systems  Gastrointestinal:  Positive for constipation. Negative for nausea and vomiting.  Genitourinary:  Positive for decreased urine volume, difficulty urinating, dysuria, pelvic pain, urgency and vaginal pain.  Neurological:  Positive for tremors (Patient says she has to "push" really hard to urinate), weakness, light-headedness and headaches. Negative for numbness.       Objective    BP 95/66 (BP Location: Left Arm, Patient Position: Sitting, Cuff Size: Normal)   Pulse 79   Temp 98.9 F (37.2 C) (Oral)   Resp 14   Ht 5\' 5"  (1.651 m)   Wt 120 lb 11.2 oz (54.7 kg)   LMP 11/20/2022 (Approximate)   SpO2 98%   BMI 20.09 kg/m    Physical Exam Vitals and nursing note reviewed.  Constitutional:      General: She is not in acute distress.    Appearance: Normal appearance. She is normal weight. She is not ill-appearing, toxic-appearing or diaphoretic.  HENT:     Head: Normocephalic and atraumatic.  Cardiovascular:     Rate and Rhythm: Normal rate and  regular rhythm.     Pulses: Normal pulses.     Heart sounds: Normal heart sounds. No murmur heard.    No friction rub. No gallop.  Pulmonary:     Effort: Pulmonary effort is normal. No respiratory distress.     Breath sounds: Normal breath sounds. No stridor. No wheezing, rhonchi or rales.  Chest:     Chest wall: No tenderness.  Abdominal:     General: Bowel sounds are normal.     Palpations: Abdomen is soft.     Tenderness: There is no right CVA tenderness or left CVA tenderness.   Musculoskeletal:        General: No swelling, tenderness, deformity or signs of injury. Normal range of motion.     Right lower leg: No edema.     Left lower leg: No edema.  Skin:    General: Skin is warm and dry.     Capillary Refill: Capillary refill takes less than 2 seconds.     Coloration: Skin is not jaundiced or pale.     Findings: No bruising, erythema, lesion or rash.  Neurological:     General: No focal deficit present.     Mental Status: She is alert and oriented to person, place, and time. Mental status is at baseline.     Cranial Nerves: No cranial nerve deficit.     Sensory: No sensory deficit.     Motor: No weakness.     Coordination: Coordination normal.  Psychiatric:        Mood and Affect: Mood normal.        Behavior: Behavior normal.        Thought Content: Thought content normal.        Judgment: Judgment normal.     No results found for any visits on 12/19/22.  Assessment & Plan     Problem List Items Addressed This Visit       Digestive   Chronic constipation    RLQ pain; working on POC and medications with GI Has tried miralax and metamucil to assist  Variable pattern       Relevant Orders   Ambulatory referral to Gastroenterology     Other   Chronic pain syndrome - Primary    Patient concern for fibromyalgia; reports that she was seen at neuro however, they focused on headaches and concern for rebound issue due to increase in medication use Has had a follow up with EMG going forward for further assistance Previously seen by PT without improvement Trial of cymbalta 30 mg start with goal of 30 mg BID Pt has stopped remeron given poor effectiveness        Relevant Medications   DULoxetine (CYMBALTA) 30 MG capsule   Menorrhagia with regular cycle    RLQ pain; previously seen in 2021; concern for possible involvement of both pelvic pain and back pain correlation Referral to GYN       Relevant Orders   Ambulatory referral to Obstetrics /  Gynecology   Return in about 6 weeks (around 01/30/2023) for anxiety and depression- cymbalta start for pain mgmt, flat affect .     Vonna Kotyk, FNP, have reviewed all documentation for this visit. The documentation on 12/20/22 for the exam, diagnosis, procedures, and orders are all accurate and complete.  Gwyneth Sprout, [place] [phone] (phone) [phone] (fax)  [place]

## 2022-12-20 ENCOUNTER — Encounter: Payer: Self-pay | Admitting: Family Medicine

## 2022-12-20 NOTE — Assessment & Plan Note (Signed)
RLQ pain; working on POC and medications with GI Has tried miralax and metamucil to assist  Variable pattern

## 2022-12-20 NOTE — Assessment & Plan Note (Signed)
Patient concern for fibromyalgia; reports that she was seen at neuro however, they focused on headaches and concern for rebound issue due to increase in medication use Has had a follow up with EMG going forward for further assistance Previously seen by PT without improvement Trial of cymbalta 30 mg start with goal of 30 mg BID Pt has stopped remeron given poor effectiveness

## 2022-12-20 NOTE — Assessment & Plan Note (Signed)
RLQ pain; previously seen in 2021; concern for possible involvement of both pelvic pain and back pain correlation Referral to GYN

## 2023-01-04 NOTE — Progress Notes (Deleted)
01/05/2023 1:05 PM   Robin Whitaker Mar 05, 1986 [id number]  Referring provider: Gwyneth Sprout, [place],  [place] [address]  Urological history: 1. OAB wet -Contributing factors of anxiety and smoking  2. Low risk hematuria -smoker -RUS (12/2022) NED  -no reports of gross hematuria   No chief complaint on file.   HPI: Robin Whitaker is a 37 y.o. female who presents today 6 week follow up after a trial of Gemtesa 75 mg daily.    PVR ***   PMH: Past Medical History:  Diagnosis Date   Allergy    Anxiety    Chronic back pain    Hepatitis C    Migraine     Surgical History: Past Surgical History:  Procedure Laterality Date   APPENDECTOMY     WISDOM TOOTH EXTRACTION      Home Medications:  Allergies as of 01/05/2023       Reactions   Cortisone Other (See Comments)   Ibuprofen    Other reaction(s): GI problems   Other    Trees, grass,dander        Medication List        Accurate as of January 04, 2023  1:05 PM. If you have any questions, ask your nurse or doctor.          clonazePAM 0.5 MG tablet Commonly known as: KLONOPIN TAKE 0.25 MG DAILY FOR 1 WEEK, THEN INCREASE TO 0.25 MG TWICE A DAY   DULoxetine 30 MG capsule Commonly known as: CYMBALTA Take 1 capsule (30 mg total) by mouth 2 (two) times daily.   gabapentin 400 MG capsule Commonly known as: NEURONTIN TAKE 1 TO 2 CAPSULES BY MOUTH TWICE DAILY   Gemtesa 75 MG Tabs Generic drug: Vibegron Take 1 tablet (75 mg total) by mouth daily.   SUMAtriptan 100 MG tablet Commonly known as: IMITREX TAKE 1 TABLET BY MOUTH AT ONSET OF MIGRAINE - MAY REPEAT IN 2 HOURS IF NEEDED - MAX OF 2 TABS IN 24 HRS   Vitamin D (Ergocalciferol) 1.25 MG (50000 UNIT) Caps capsule Commonly known as: DRISDOL Take 1 capsule (50,000 Units total) by mouth every 7 (seven) days.        Allergies:  Allergies  Allergen Reactions   Cortisone Other (See Comments)   Ibuprofen     Other  reaction(s): GI problems   Other     Trees, grass,dander    Family History: Family History  Problem Relation Age of Onset   Alcohol abuse Mother    ADD / ADHD Mother    Anxiety disorder Mother    Hypertension Mother    Diabetes Mother    Hypertension Father    Leukemia Brother    Anuerysm Maternal Grandfather    Arthritis Maternal Grandmother    Congestive Heart Failure Paternal Grandmother     Social History:  reports that she has been smoking e-cigarettes. She has been exposed to tobacco smoke. She uses smokeless tobacco. She reports that she does not currently use alcohol. She reports current drug use. Drug: Marijuana.  ROS: Pertinent ROS in HPI  Physical Exam: LMP 11/20/2022 (Approximate)   Constitutional:  Well nourished. Alert and oriented, No acute distress. HEENT: [place] AT, moist mucus membranes.  Trachea midline, no masses. Cardiovascular: No clubbing, cyanosis, or edema. Respiratory: Normal respiratory effort, no increased work of breathing. GU: No CVA tenderness.  No bladder fullness or masses. Vulvovaginal atrophy w/ pallor, loss of rugae, introital retraction, excoriations.  Vulvar thinning, fusion  of labia, clitoral hood retraction, prominent urethral meatus.   *** external genitalia, *** pubic hair distribution, no lesions.  Normal urethral meatus, no lesions, no prolapse, no discharge.   No urethral masses, tenderness and/or tenderness. No bladder fullness, tenderness or masses. *** vagina mucosa, *** estrogen effect, no discharge, no lesions, *** pelvic support, *** cystocele and *** rectocele noted.  No cervical motion tenderness.  Uterus is freely mobile and non-fixed.  No adnexal/parametria masses or tenderness noted.  Anus and perineum are without rashes or lesions.   ***  Neurologic: Grossly intact, no focal deficits, moving all 4 extremities. Psychiatric: Normal mood and affect.    Laboratory Data: Lab Results  Component Value Date   WBC 6.4 10/30/2022   HGB  13.6 10/30/2022   HCT 41.3 10/30/2022   MCV 94.7 10/30/2022   PLT 318 10/30/2022    Lab Results  Component Value Date   CREATININE 0.73 10/24/2022    Lab Results  Component Value Date   HGBA1C 5.2 10/24/2022    Lab Results  Component Value Date   TSH 0.761 10/24/2022    Lab Results  Component Value Date   AST 17 10/24/2022   Lab Results  Component Value Date   ALT 9 10/24/2022  I have reviewed the labs.   Pertinent Imaging: ***  Assessment & Plan:  ***  1. OAB -We discussed that overactive bladder (OAB) is not a disease, but is a symptom complex that is generally not life-threatening.  Symptoms typically include urinary urgency, frequency, and urge incontinence. -  There are numerous treatment options, however there are risks and benefits with both medical and surgical management.  - First-line treatment is behavioral therapies including bladder training, pelvic floor muscle training, and fluid management.   -Second line treatments include oral antimuscarinics(Ditropan er, Trospium) and beta-3 agonist (Mybetriq). There is typically a period of medication trial (4-8 weeks) to find the optimal therapy and dosing. If symptoms are bothersome despite the above management, third line options include intra-detrusor botox, peripheral tibial nerve stimulation (PTNS), and interstim (SNS). These are more invasive treatments with higher side effect profile, but may improve quality of life for patients with severe OAB symptoms.  ***  2.  Low risk hematuria -Smoker -RUS (12/2022) NED -no reports of gross heme -follow up one year for UA    No follow-ups on file.  These notes generated with voice recognition software. I apologize for typographical errors.  [place], [place] [address]  [place], [place] [address] [phone]

## 2023-01-05 ENCOUNTER — Ambulatory Visit: Payer: Medicaid Other | Admitting: Urology

## 2023-01-05 ENCOUNTER — Encounter: Payer: Self-pay | Admitting: Urology

## 2023-01-05 DIAGNOSIS — N3281 Overactive bladder: Secondary | ICD-10-CM

## 2023-01-09 ENCOUNTER — Ambulatory Visit: Payer: Medicaid Other | Admitting: Psychiatry

## 2023-02-12 ENCOUNTER — Emergency Department
Admission: EM | Admit: 2023-02-12 | Discharge: 2023-02-13 | Disposition: A | Discharge status: Home / Self Care | Payer: Medicaid Other | Attending: Emergency Medicine | Admitting: Emergency Medicine

## 2023-02-12 ENCOUNTER — Emergency Department: Payer: Medicaid Other

## 2023-02-12 DIAGNOSIS — Z1152 Encounter for screening for COVID-19: Secondary | ICD-10-CM | POA: Diagnosis not present

## 2023-02-12 DIAGNOSIS — G8929 Other chronic pain: Secondary | ICD-10-CM | POA: Diagnosis not present

## 2023-02-12 DIAGNOSIS — J069 Acute upper respiratory infection, unspecified: Secondary | ICD-10-CM

## 2023-02-12 DIAGNOSIS — M542 Cervicalgia: Secondary | ICD-10-CM | POA: Diagnosis present

## 2023-02-12 DIAGNOSIS — R519 Headache, unspecified: Secondary | ICD-10-CM | POA: Insufficient documentation

## 2023-02-12 LAB — BASIC METABOLIC PANEL
Anion gap: 8 (ref 5–15)
BUN: 10 mg/dL (ref 6–20)
CO2: 27 mmol/L (ref 22–32)
Calcium: 8.6 mg/dL — ABNORMAL LOW (ref 8.9–10.3)
Chloride: 100 mmol/L (ref 98–111)
Creatinine, Ser: 0.87 mg/dL (ref 0.44–1.00)
GFR, Estimated: >60 mL/min (ref >60)
Glucose, Bld: 111 mg/dL — ABNORMAL HIGH (ref 70–99)
Potassium: 3.6 mmol/L (ref 3.5–5.1)
Sodium: 135 mmol/L (ref 135–145)

## 2023-02-12 LAB — CBC WITH DIFFERENTIAL/PLATELET
Abs Immature Granulocytes: 0.04 10*3/uL (ref 0.00–0.07)
Basophils Absolute: 0.1 10*3/uL (ref 0.0–0.1)
Basophils Relative: 1 %
Eosinophils Absolute: 0.7 10*3/uL — ABNORMAL HIGH (ref 0.0–0.5)
Eosinophils Relative: 6 %
HCT: 40.5 % (ref 36.0–46.0)
Hemoglobin: 13.2 g/dL (ref 12.0–15.0)
Immature Granulocytes: 0 %
Lymphocytes Relative: 16 %
Lymphs Abs: 2 10*3/uL (ref 0.7–4.0)
MCH: 31.2 pg (ref 26.0–34.0)
MCHC: 32.6 g/dL (ref 30.0–36.0)
MCV: 95.7 fL (ref 80.0–100.0)
Monocytes Absolute: 0.7 10*3/uL (ref 0.1–1.0)
Monocytes Relative: 6 %
Neutro Abs: 8.8 10*3/uL — ABNORMAL HIGH (ref 1.7–7.7)
Neutrophils Relative %: 71 %
Platelets: 285 10*3/uL (ref 150–400)
RBC: 4.23 MIL/uL (ref 3.87–5.11)
RDW: 12.8 % (ref 11.5–15.5)
WBC: 12.3 10*3/uL — ABNORMAL HIGH (ref 4.0–10.5)
nRBC: 0 % (ref 0.0–0.2)

## 2023-02-12 LAB — RESP PANEL BY RT-PCR (RSV, FLU A&B, COVID)  RVPGX2
Influenza A by PCR: NEGATIVE
Influenza B by PCR: NEGATIVE
Resp Syncytial Virus by PCR: NEGATIVE
SARS Coronavirus 2 by RT PCR: NEGATIVE

## 2023-02-12 LAB — URINALYSIS, ROUTINE W REFLEX MICROSCOPIC
Bacteria, UA: NONE SEEN
Bilirubin Urine: NEGATIVE
Glucose, UA: NEGATIVE mg/dL
Nitrite: NEGATIVE
Protein, ur: NEGATIVE mg/dL
Specific Gravity, Urine: 1.015 (ref 1.005–1.030)
pH: 7.5 (ref 5.0–8.0)

## 2023-02-12 LAB — POC URINE PREG, ED: Preg Test, Ur: NEGATIVE

## 2023-02-12 MED ORDER — IOHEXOL 350 MG/ML SOLN
75.0000 mL | Freq: Once | INTRAVENOUS | Status: AC | PRN
  Administered 2023-02-12: 75 mL via INTRAVENOUS

## 2023-02-12 MED ORDER — PROCHLORPERAZINE EDISYLATE 10 MG/2ML IJ SOLN
10.0000 mg | Freq: Once | INTRAMUSCULAR | Status: AC
  Administered 2023-02-12: 10 mg via INTRAVENOUS
  Filled 2023-02-12: qty 2

## 2023-02-12 MED ORDER — DIPHENHYDRAMINE HCL 50 MG/ML IJ SOLN
25.0000 mg | Freq: Once | INTRAMUSCULAR | Status: AC
  Administered 2023-02-12: 25 mg via INTRAVENOUS
  Filled 2023-02-12: qty 1

## 2023-02-12 MED ORDER — KETOROLAC TROMETHAMINE 30 MG/ML IJ SOLN
15.0000 mg | Freq: Once | INTRAMUSCULAR | Status: AC
  Administered 2023-02-12: 15 mg via INTRAVENOUS
  Filled 2023-02-12: qty 1

## 2023-02-12 NOTE — ED Provider Notes (Signed)
[facility] Provider Note    Event Date/Time   First MD Initiated Contact with Patient 02/12/23 2158     (approximate)   History   Chief Complaint Neck Pain   HPI  Robin Whitaker is a 37 y.o. female with past medical history of migraines, chronic neck pain, and anxiety who presents to the ED complaining of neck pain.  Patient reports that she has been dealing with increasing pain in both sides of her neck for about the past 2 weeks.  She describes the pain as a constant throbbing, states it is different than the usual pain that is in the posterior portion of her neck.  She denies any falls or other injuries to her head or neck, but has also been dealing with increasing headache.  She states that the headache is different from her typical migraines due to the association with increasing lateral neck pain.  She has had some photophobia and nausea with occasional vomiting, denies abdominal pain or diarrhea.  She has not had any fevers, but endorses cough with generalized weakness and dizziness.  Her LMP was approximately 1 week ago when she denies any dysuria.  She denies any vision changes, speech changes, numbness, or weakness.     Physical Exam   Triage Vital Signs: ED Triage Vitals  Enc Vitals Group     BP 02/12/23 1955 133/88     Pulse Rate 02/12/23 1955 76     Resp 02/12/23 1955 18     Temp 02/12/23 1955 98.3 F (36.8 C)     Temp Source 02/12/23 1955 Oral     SpO2 02/12/23 1955 99 %     Weight 02/12/23 1952 125 lb (56.7 kg)     Height 02/12/23 1952 5\' 5"  (1.651 m)     Head Circumference --      Peak Flow --      Pain Score 02/12/23 1952 6     Pain Loc --      Pain Edu? --      Excl. in GC? --     Most recent vital signs: Vitals:   02/12/23 1955  BP: 133/88  Pulse: 76  Resp: 18  Temp: 98.3 F (36.8 C)  SpO2: 99%    Constitutional: Alert and oriented. Eyes: Conjunctivae are normal.  Pupils equal, round, reactive to light  bilaterally. Head: Atraumatic. Nose: No congestion/rhinnorhea. Mouth/Throat: Mucous membranes are moist.  Neck: No midline cervical spine tenderness to palpation.  No lateral neck tenderness to palpation and no overlying erythema or warmth noted.  Neck is supple with no meningismus. Cardiovascular: Normal rate, regular rhythm. Grossly normal heart sounds.  2+ radial pulses bilaterally. Respiratory: Normal respiratory effort.  No retractions. Lungs CTAB. Gastrointestinal: Soft and nontender. No distention. Musculoskeletal: No lower extremity tenderness nor edema.  Neurologic:  Normal speech and language. No gross focal neurologic deficits are appreciated.    ED Results / Procedures / Treatments   Labs (all labs ordered are listed, but only abnormal results are displayed) Labs Reviewed  URINALYSIS, ROUTINE W REFLEX MICROSCOPIC - Abnormal; Notable for the following components:      Result Value   Hgb urine dipstick TRACE (*)    Ketones, ur TRACE (*)    Leukocytes,Ua SMALL (*)    All other components within normal limits  CBC WITH DIFFERENTIAL/PLATELET - Abnormal; Notable for the following components:   WBC 12.3 (*)    Neutro Abs 8.8 (*)    Eosinophils Absolute 0.7 (*)  All other components within normal limits  BASIC METABOLIC PANEL - Abnormal; Notable for the following components:   Glucose, Bld 111 (*)    Calcium 8.6 (*)    All other components within normal limits  RESP PANEL BY RT-PCR (RSV, FLU A&B, COVID)  [id number]  POC URINE PREG, ED    RADIOLOGY Chest x-ray reviewed and interpreted by me with no infiltrate, edema, or effusion.  PROCEDURES:  Critical Care performed: No  Procedures   MEDICATIONS ORDERED IN ED: Medications  prochlorperazine (COMPAZINE) injection 10 mg (10 mg Intravenous Given 02/12/23 2233)  diphenhydrAMINE (BENADRYL) injection 25 mg (25 mg Intravenous Given 02/12/23 2232)  ketorolac (TORADOL) 30 MG/ML injection 15 mg (15 mg Intravenous Given 02/12/23  2232)  iohexol (OMNIPAQUE) 350 MG/ML injection 75 mL (75 mLs Intravenous Contrast Given 02/12/23 2319)     IMPRESSION / MDM / ASSESSMENT AND PLAN / ED COURSE  I reviewed the triage vital signs and the nursing notes.                              37 y.o. female with past medical history of migraines, chronic neck pain, and anxiety who presents to the ED complaining of 2 weeks of increasing pain to both sides of her neck associated with headache, nausea, dizziness, weakness, and cough.  Patient's presentation is most consistent with acute presentation with potential threat to life or bodily function.  Differential diagnosis includes, but is not limited to, carotid dissection, vertebral dissection, SAH, meningitis, migraine headache, tension headache, pneumonia, viral illness, anemia, electrode abnormality, AKI.  Patient nontoxic-appearing and in no acute distress, vital signs are unremarkable.  She has no focal neurologic deficits on exam and with gradually worsening symptoms over the past 2 weeks I doubt SAH.  No findings concerning for meningitis, but patient is particularly concerned about possible vascular issue with throbbing discomfort in both sides of her neck.  With her reported dizziness, will rule out vertebral or carotid dissection with CT angiogram.  Labs and pregnancy testing are pending at this time, we will treat symptomatically with IV Compazine, Benadryl, and Toradol.  Pregnancy testing is negative and urinalysis shows no signs of infection, labs without significant anemia, leukocytosis, showed abnormality, or AKI.  Patient turned over to oncoming provider pending CTA and chest x-ray results, if these are unremarkable then patient would be appropriate for discharge home with follow-up with her neurologist.      FINAL CLINICAL IMPRESSION(S) / ED DIAGNOSES   Final diagnoses:  Chronic nonintractable headache, unspecified headache type     Rx / DC Orders   ED Discharge Orders      None        Note:  This document was prepared using Dragon voice recognition software and may include unintentional dictation errors.   Chesley Noon, MD 02/12/23 [phone]

## 2023-02-12 NOTE — ED Triage Notes (Signed)
Ambulatory to triage with c/o bilateral neck pain for several weeks. Started in back of neck then progressed to being on both sides. Hx of migraines. Denies changes to vision, but does have pain behind eyes. Endorses photosensitivity.

## 2023-02-12 NOTE — ED Provider Notes (Signed)
-----------------------------------------   11:18 PM on 02/12/2023 -----------------------------------------  Assuming care from Dr. Larinda Buttery.  In short, Robin Whitaker is a 37 y.o. female with a chief complaint of headache and neck pain.  Refer to the original H&P for additional details.  The current plan of care is to follow up on CTA head/neck and reassess.  Anticipate discharge given chronicity of symptoms and reassuring exam thus far.   Clinical Course as of 02/13/23 0818  Tue Feb 13, 2023  1610 I viewed and interpreted the patient's CTA head/neck.  I see no obvious aneurysm or evidence of bleeding.  Radiology report confirms no acute abnormalities.  I also viewed and interpreted the two-view chest x-ray which shows no sign of pneumonia.  Radiology confirmed no acute findings, commented on some mild bronchitic changes which likely explains the patient's symptoms.  Patient feels better after "migraine cocktail".  I have provided the reassuring results.  Encouraged outpatient follow-up with Dr. Malvin Johns.  Patient understands and agrees. [CF]    Clinical Course User Index [CF] Loleta Rose, MD     Medications  prochlorperazine (COMPAZINE) injection 10 mg (10 mg Intravenous Given 02/12/23 2233)  diphenhydrAMINE (BENADRYL) injection 25 mg (25 mg Intravenous Given 02/12/23 2232)  ketorolac (TORADOL) 30 MG/ML injection 15 mg (15 mg Intravenous Given 02/12/23 2232)  iohexol (OMNIPAQUE) 350 MG/ML injection 75 mL (75 mLs Intravenous Contrast Given 02/12/23 2319)     ED Discharge Orders     None      Final diagnoses:  Chronic nonintractable headache, unspecified headache type  Viral URI with cough     Loleta Rose, MD 02/13/23 [id number]

## 2023-02-13 NOTE — Discharge Instructions (Signed)
Your workup in the Emergency Department today was reassuring.  We did not find any specific abnormalities.  We recommend you drink plenty of fluids, take your regular medications and/or any new ones prescribed today, and follow up with the doctor(s) listed in these documents as recommended.  Return to the Emergency Department if you develop new or worsening symptoms that concern you.  

## 2023-03-04 ENCOUNTER — Other Ambulatory Visit: Payer: Self-pay | Admitting: Family Medicine

## 2023-03-04 DIAGNOSIS — G43909 Migraine, unspecified, not intractable, without status migrainosus: Secondary | ICD-10-CM

## 2023-03-05 NOTE — Telephone Encounter (Signed)
Requested Prescriptions  Pending Prescriptions Disp Refills   SUMAtriptan (IMITREX) 100 MG tablet [Pharmacy Med Name: SUMAtriptan Succinate 100 MG Oral Tablet] 12 tablet 0    Sig: TAKE ONE TABLET BY MOUTH AT ONSET OF MIGRAINE-MAY REPEAT IN 2 HOURS AS NEEDED-MAX OF 2 TABLETS  IN 24 HOURS.     Neurology:  Migraine Therapy - Triptan Passed - 03/04/2023  4:39 PM      Passed - Last BP in normal range    BP Readings from Last 1 Encounters:  02/13/23 106/62         Passed - Valid encounter within last 12 months    Recent Outpatient Visits           2 months ago Chronic pain syndrome   [facility] Merita Norton T, FNP   4 months ago Chronic pain syndrome   [facility] Jacky Kindle, FNP   7 months ago Neck pain   [facility] [place], [place], PA-C   1 year ago Migraine without status migrainosus, not intractable, unspecified migraine type   [facility] Jacky Kindle, FNP   1 year ago Positive hepatitis C antibody test   [facility] Jacky Kindle, FNP

## 2023-03-26 ENCOUNTER — Ambulatory Visit: Payer: Medicaid Other | Admitting: Family Medicine

## 2023-03-28 ENCOUNTER — Other Ambulatory Visit: Payer: Self-pay | Admitting: Neurology

## 2023-03-28 DIAGNOSIS — G8929 Other chronic pain: Secondary | ICD-10-CM

## 2023-03-29 IMAGING — CT CT RENAL STONE PROTOCOL
3 of 4 series · 9 of 46 positions shown, 14 images · non-contrast
Comparison: None.

CLINICAL DATA: Flank pain.  Kidney stone suspected.

EXAM:
CT ABDOMEN AND PELVIS WITHOUT CONTRAST
TECHNIQUE: Multidetector CT imaging of the abdomen and pelvis was performed
following the standard protocol without IV contrast.

[Series 4: lung bases · axial · 0.63mm/px · z∈[-211,-136]mm · 5 of 23 slices shown, 10 images]
[im 4/23  soft-tissue]
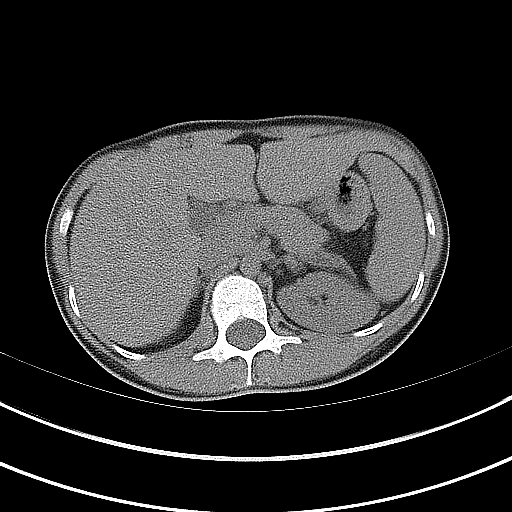
[im 4/23  bone]
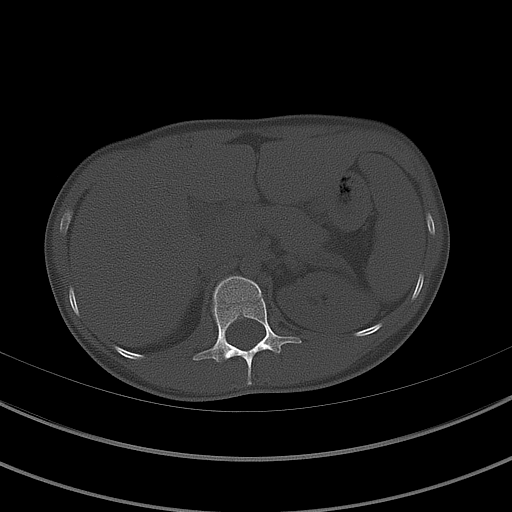
[im 8/23  soft-tissue]
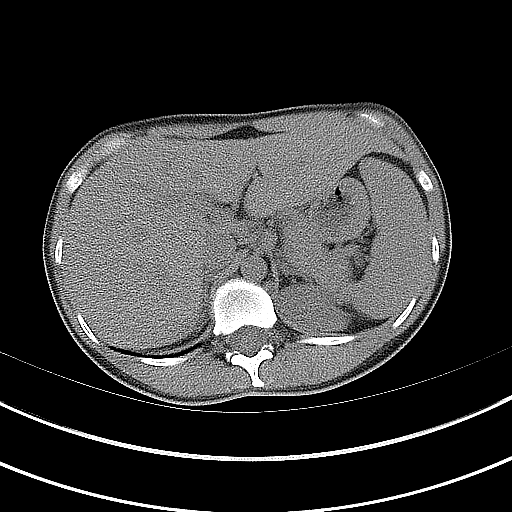
[im 8/23  lung]
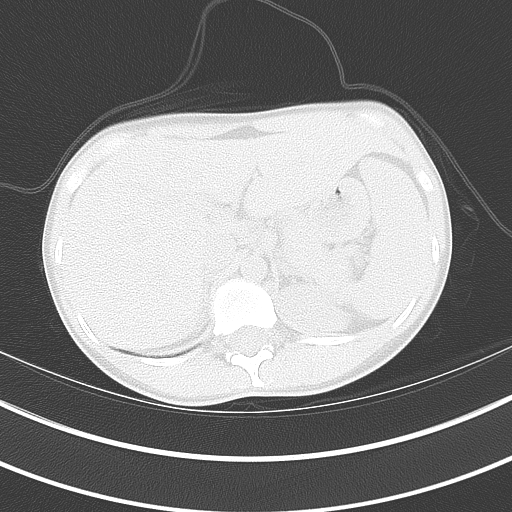
[im 12/23  soft-tissue]
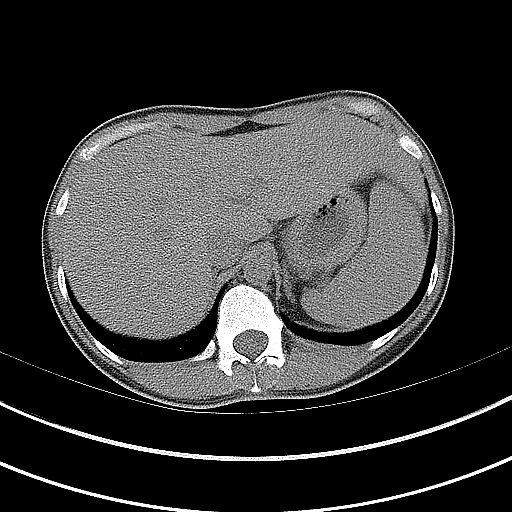
[im 12/23  lung]
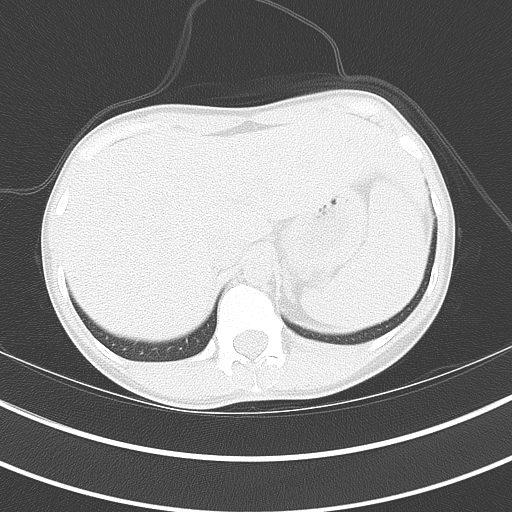
[im 15/23  soft-tissue]
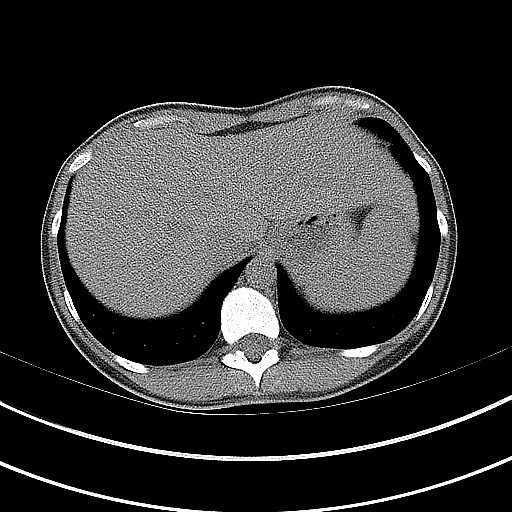
[im 15/23  lung]
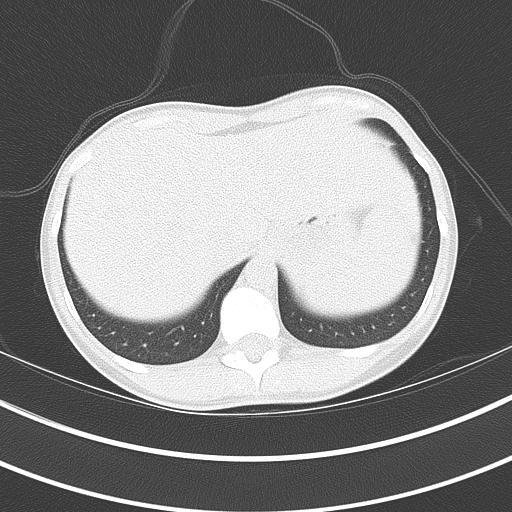
[im 19/23  soft-tissue]
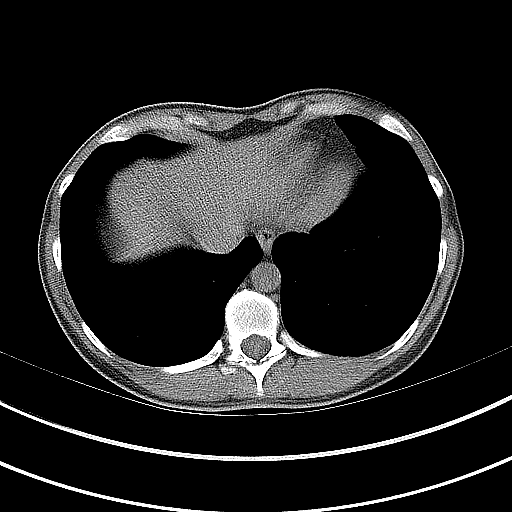
[im 19/23  lung]
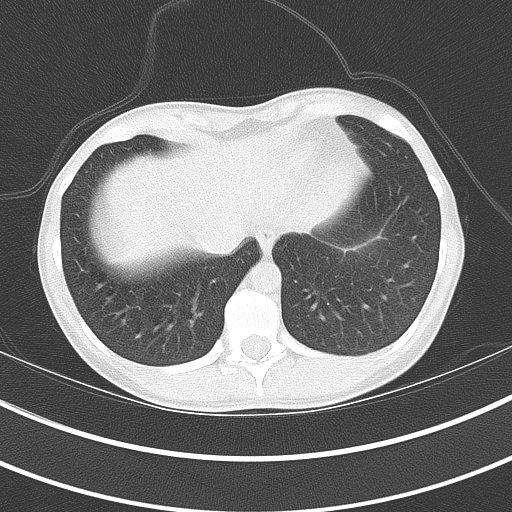

[Series 5: coronal · coronal · 0.65mm/px · 3 of 107 slices shown]
[im 36/107  soft-tissue]
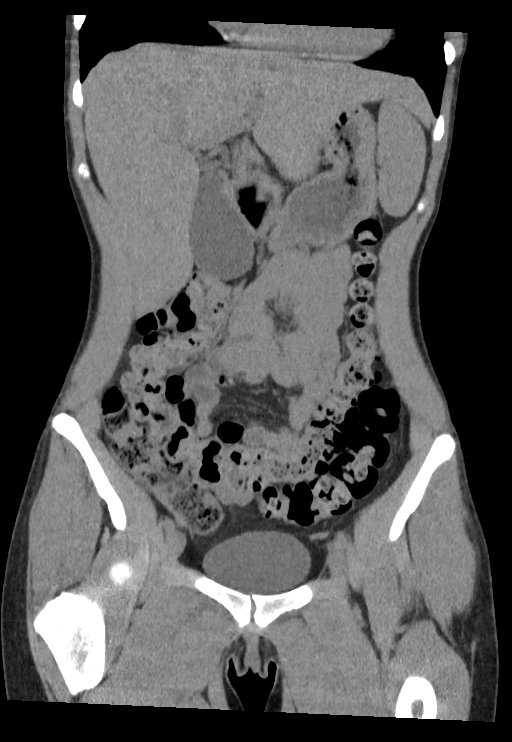
[im 48/107  soft-tissue]
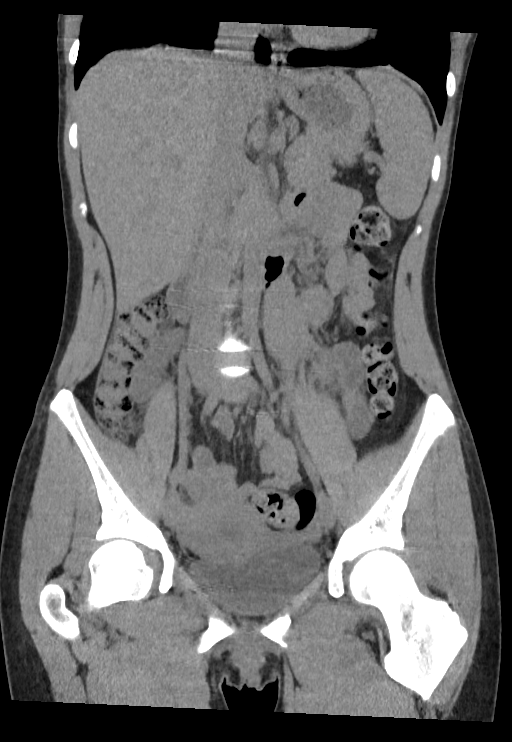
[im 59/107  soft-tissue]
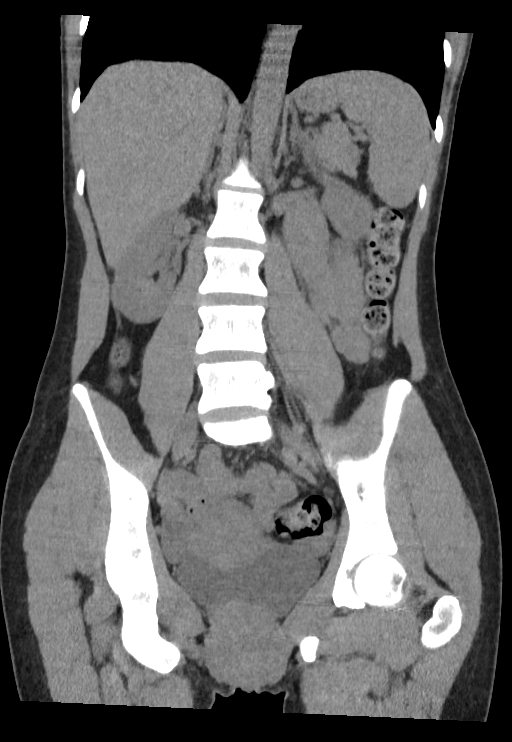

[Series 6: sagittal · sagittal · 0.48mm/px · 1 of 156 slices shown]
[im 52/156  soft-tissue]
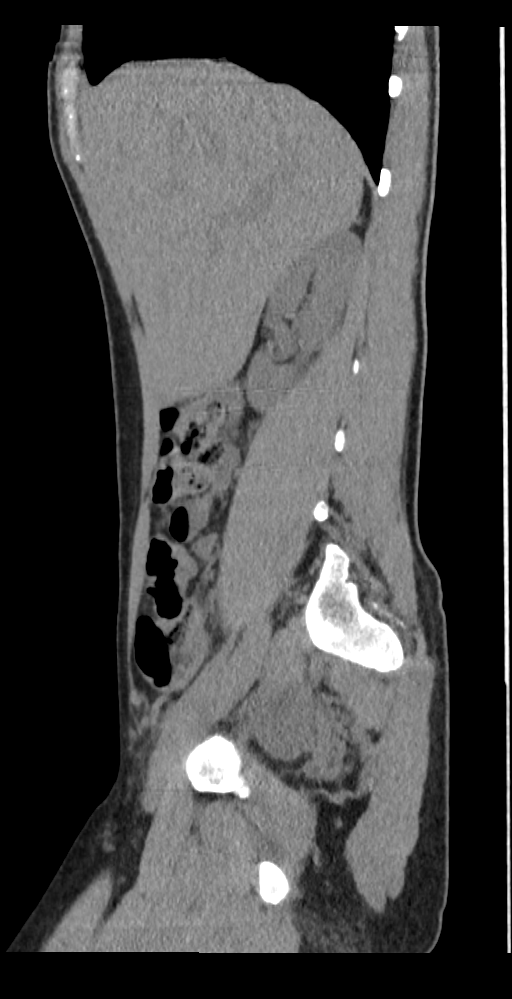

[9 of 46 positions shown; findings below may reference images not displayed]

FINDINGS: Lower chest: No acute abnormality.

Hepatobiliary: No focal liver abnormality. No gallstones,
gallbladder wall thickening, or pericholecystic fluid. No biliary
dilatation.

Pancreas: No focal lesion. Normal pancreatic contour. No surrounding
inflammatory changes. No main pancreatic ductal dilatation.

Spleen: Normal in size without focal abnormality.

Adrenals/Urinary Tract:

No adrenal nodule bilaterally.

Punctate calcification within the left kidney. No right
nephrolithiasis. No hydronephrosis, and no contour-deforming renal
mass. No ureterolithiasis or hydroureter.

The urinary bladder is unremarkable.

Stomach/Bowel: Stomach is within normal limits. No evidence of bowel
wall thickening or dilatation. The appendix is not definitely
identified.

Vascular/Lymphatic: No abdominal aorta or iliac aneurysm. Mild
atherosclerotic plaque of the aorta and its branches. No abdominal,
pelvic, or inguinal lymphadenopathy.

Reproductive: Uterus and bilateral adnexa are unremarkable.

Other: Trace simple free fluid within the pelvis. No intraperitoneal
free gas. No organized fluid collection.

Musculoskeletal: No acute or significant osseous findings.
IMPRESSION: 1. Punctate nonobstructive left nephrolithiasis.
2. Otherwise no acute intra-abdominal or intrapelvic abnormality on
this noncontrast study.

## 2023-03-30 ENCOUNTER — Encounter: Payer: Self-pay | Admitting: Neurology

## 2023-03-30 ENCOUNTER — Ambulatory Visit
Admission: RE | Admit: 2023-03-30 | Discharge: 2023-03-30 | Disposition: A | Discharge status: Home / Self Care | Payer: Medicaid Other | Attending: Physician Assistant | Admitting: Physician Assistant

## 2023-03-30 ENCOUNTER — Ambulatory Visit (INDEPENDENT_AMBULATORY_CARE_PROVIDER_SITE_OTHER): Payer: Medicaid Other | Admitting: Physician Assistant

## 2023-03-30 ENCOUNTER — Ambulatory Visit
Admission: RE | Admit: 2023-03-30 | Discharge: 2023-03-30 | Disposition: A | Discharge status: Home / Self Care | Payer: Medicaid Other | Source: Ambulatory Visit | Attending: Physician Assistant | Admitting: Physician Assistant

## 2023-03-30 VITALS — BP 86/72 | HR 107 | Ht 65.0 in | Wt 116.7 lb

## 2023-03-30 DIAGNOSIS — M25511 Pain in right shoulder: Secondary | ICD-10-CM | POA: Insufficient documentation

## 2023-03-30 DIAGNOSIS — M545 Low back pain, unspecified: Secondary | ICD-10-CM | POA: Diagnosis not present

## 2023-03-30 DIAGNOSIS — R6884 Jaw pain: Secondary | ICD-10-CM

## 2023-03-30 DIAGNOSIS — M542 Cervicalgia: Secondary | ICD-10-CM | POA: Diagnosis not present

## 2023-03-30 DIAGNOSIS — G8929 Other chronic pain: Secondary | ICD-10-CM

## 2023-03-30 NOTE — Progress Notes (Unsigned)
      Established patient visit   Patient: Robin Whitaker   DOB: March 08, 1986   37 y.o. Female  MRN: [id number] Visit Date: 03/30/2023  Today's healthcare provider: Alfredia Ferguson, PA-C   Chief Complaint  Patient presents with   Acute Visit   Mass    First noticed on left hand a year ago and was told it was a bone spur, about 2 months ago noticed on right shoulder blade and about 2-3 weeks ago noticed one by left ear.   Subjective    HPI HPI     Mass    Additional comments: First noticed on left hand a year ago and was told it was a bone spur, about 2 months ago noticed on right shoulder blade and about 2-3 weeks ago noticed one by left ear.        Comments   Growths under skin      Last edited by Acey Lav, CMA on 03/30/2023  2:34 PM.      ***  Medications: Outpatient Medications Prior to Visit  Medication Sig   clonazePAM (KLONOPIN) 0.5 MG tablet TAKE 0.25 MG DAILY FOR 1 WEEK, THEN INCREASE TO 0.25 MG TWICE A DAY   gabapentin (NEURONTIN) 400 MG capsule TAKE 1 TO 2 CAPSULES BY MOUTH TWICE DAILY   SUMAtriptan (IMITREX) 100 MG tablet TAKE ONE TABLET BY MOUTH AT ONSET OF MIGRAINE-MAY REPEAT IN 2 HOURS AS NEEDED-MAX OF 2 TABLETS  IN 24 HOURS.   Vitamin D, Ergocalciferol, (DRISDOL) 1.25 MG (50000 UNIT) CAPS capsule Take 1 capsule (50,000 Units total) by mouth every 7 (seven) days.   DULoxetine (CYMBALTA) 30 MG capsule Take 1 capsule (30 mg total) by mouth 2 (two) times daily. (Patient not taking: Reported on 03/30/2023)   Vibegron (GEMTESA) 75 MG TABS Take 1 tablet (75 mg total) by mouth daily. (Patient not taking: Reported on 03/30/2023)   No facility-administered medications prior to visit.    Review of Systems  {Labs  Heme  Chem  Endocrine  Serology  Results Review (optional):23779}   Objective    BP (!) 86/72 (BP Location: Right Arm, Patient Position: Sitting, Cuff Size: Normal)   Pulse (!) 107   Ht 5\' 5"  (1.651 m)   Wt 116 lb 11.2 oz (52.9 kg)   SpO2  99%   BMI 19.42 kg/m  {Show previous vital signs (optional):23777}  Physical Exam HENT:     Head:     Comments: B/l tmj feel aligned, L more prominent than right. No crepitus with opening mouth.  Musculoskeletal:     Comments: Prominent R AC joint  No edema erythema       No results found for any visits on 03/30/23.  Assessment & Plan     ***  No follow-ups on file.      I, Alfredia Ferguson, PA-C have reviewed all documentation for this visit. The documentation on  03/30/23   for the exam, diagnosis, procedures, and orders are all accurate and complete.  Alfredia Ferguson, PA-C [facility] [address] [place], [place], [address] Office: [phone] Fax: [phone]   [facility]

## 2023-03-30 NOTE — Patient Instructions (Signed)
[email or web address]

## 2023-04-03 ENCOUNTER — Encounter: Payer: Self-pay | Admitting: Physician Assistant

## 2023-04-16 ENCOUNTER — Other Ambulatory Visit: Payer: Medicaid Other

## 2023-10-11 IMAGING — US US LIVER ELASTOGRAPHY
1 series · 12 of 25 positions shown · non-contrast
Comparison: None.

CLINICAL DATA: hepatitis C

EXAM:
US LIVER ELASTOGRAPHY
TECHNIQUE: Sonography of the liver was performed. In addition, ultrasound
elastography evaluation of the liver was performed. A region of
interest was placed within the right lobe of the liver. Following
application of a compressive sonographic pulse, tissue
compressibility was assessed. Multiple assessments were performed at
the selected site. Median tissue compressibility was determined.
Previously, hepatic stiffness was assessed by shear wave velocity.
Based on recently published Society of Radiologists in Ultrasound
consensus article, reporting is now recommended to be performed in
the SI units of pressure (kiloPascals) representing hepatic
stiffness/elasticity. The obtained result is compared to the
published reference standards. (cACLD = compensated Advanced Chronic
Liver Disease)

[Series 1: us elastography liver · 12 of 50 slices shown]
[im 3/50]
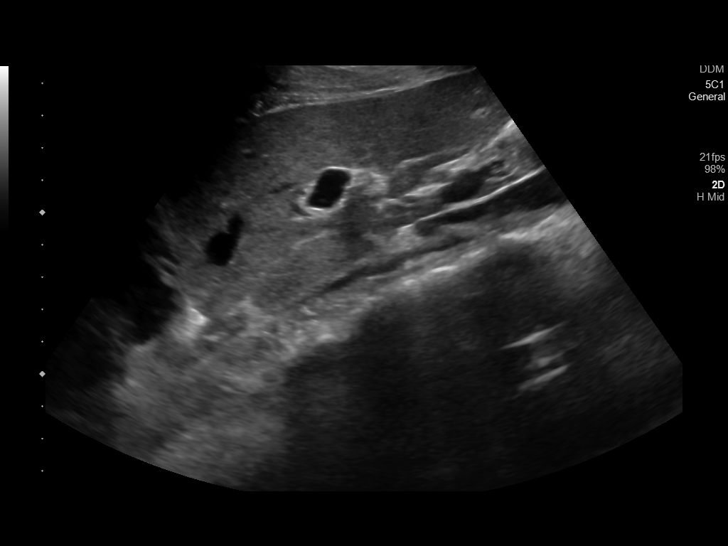
[im 7/50]
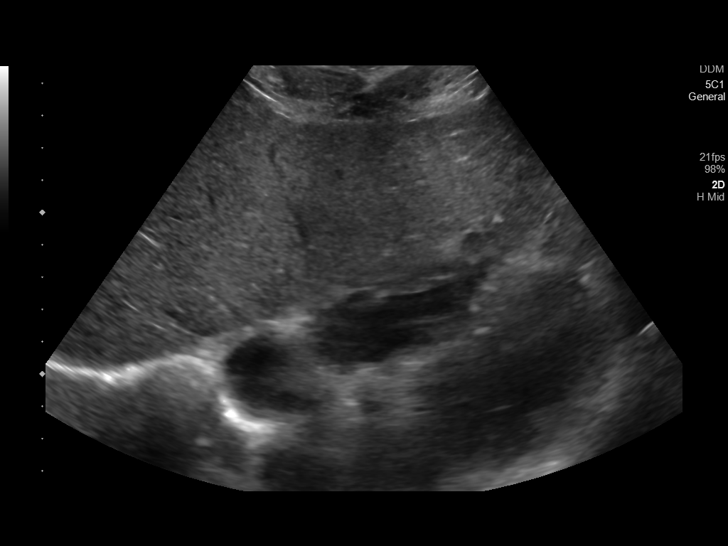
[im 11/50]
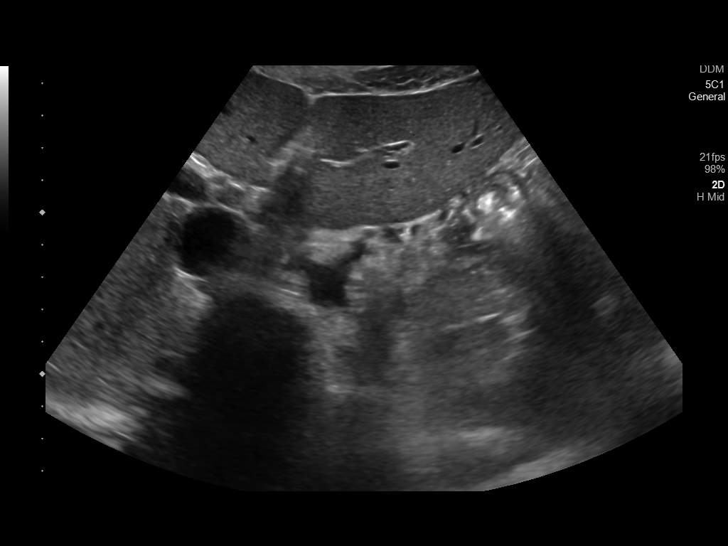
[im 15/50]
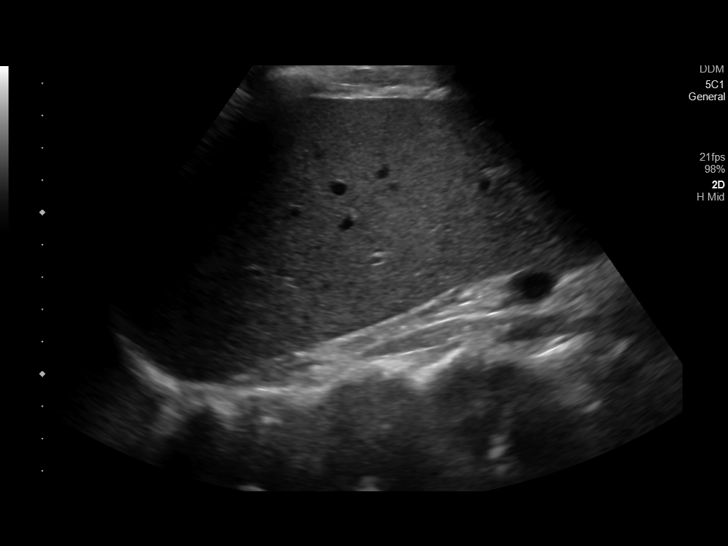
[im 19/50]
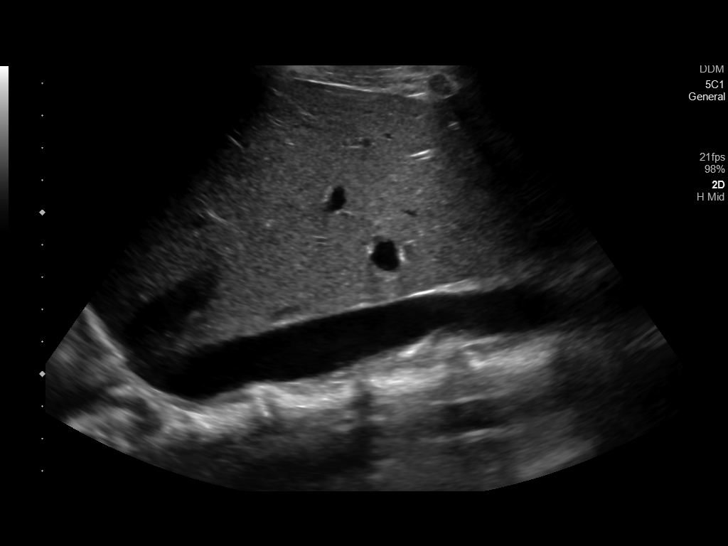
[im 23/50]
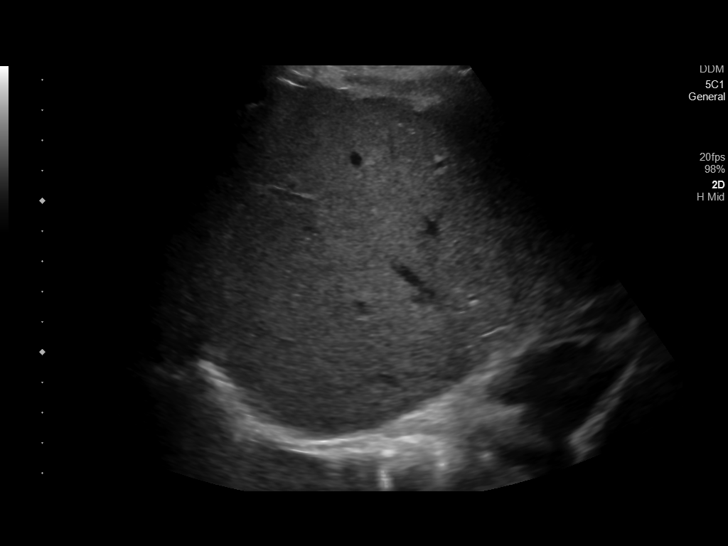
[im 27/50]
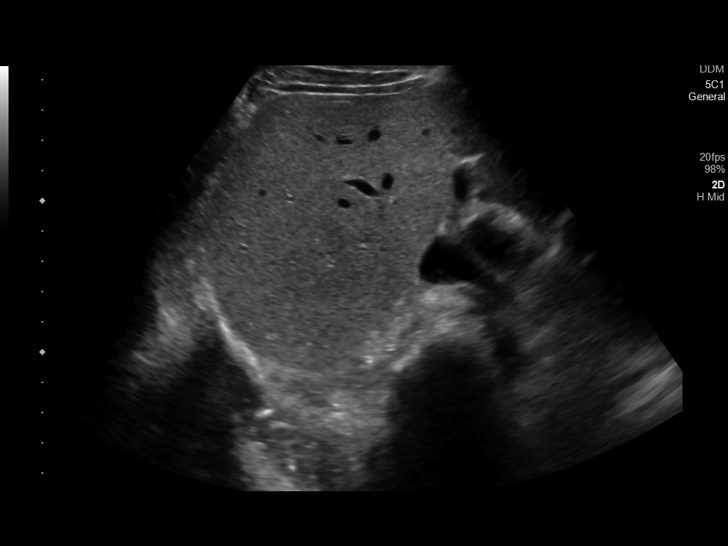
[im 31/50]
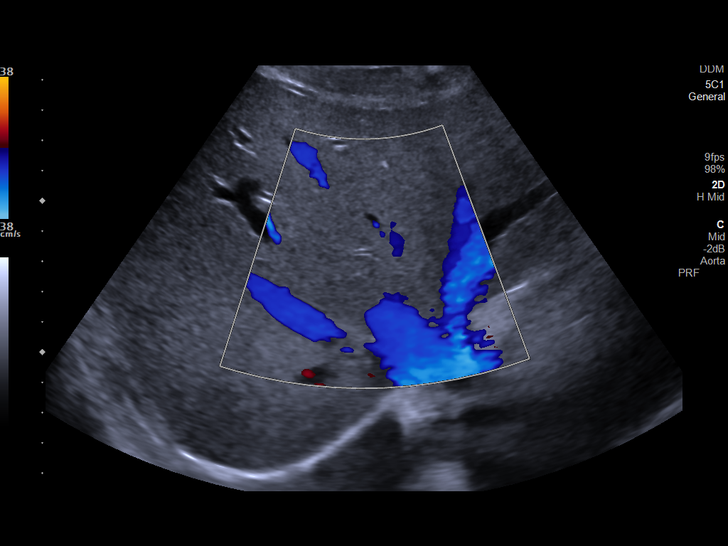
[im 35/50]
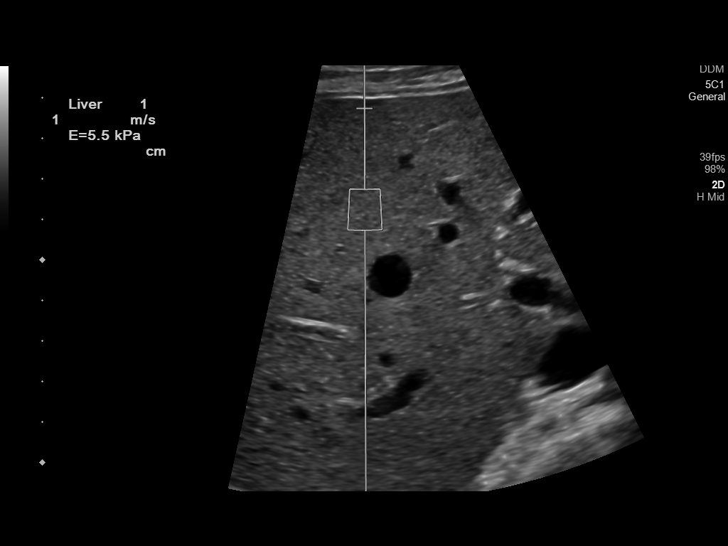
[im 39/50]
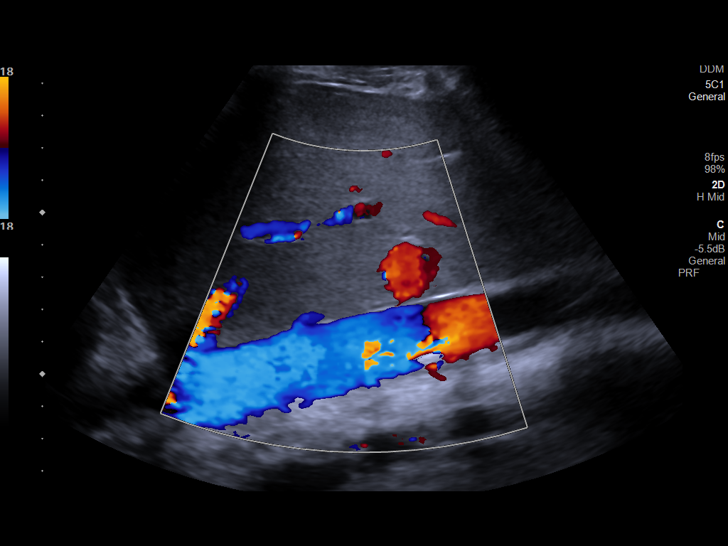
[im 43/50]
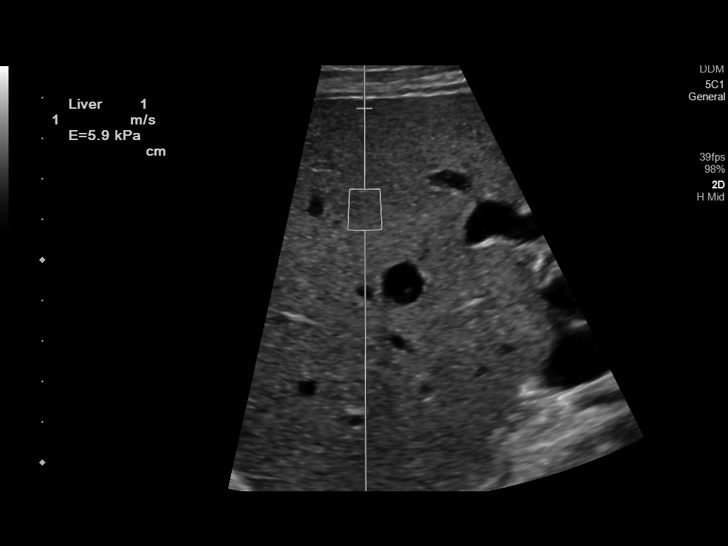
[im 47/50]
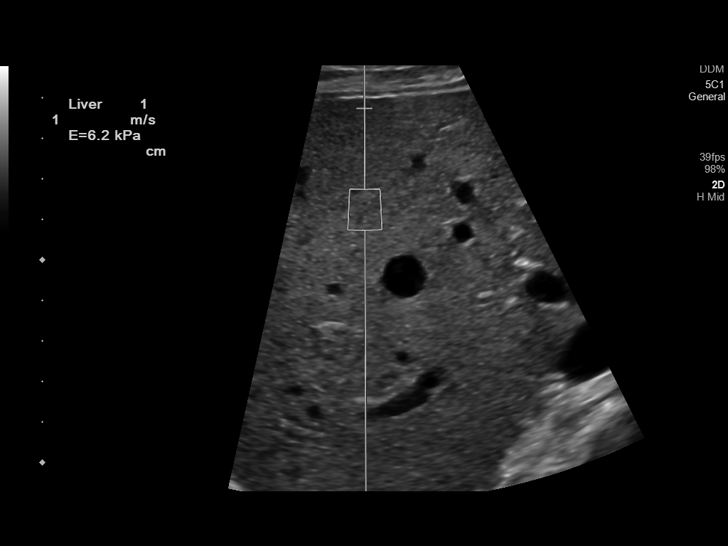

[12 of 25 positions shown; findings below may reference images not displayed]

FINDINGS: Liver: Mildly increased echotexture suggesting fatty infiltration or
intrinsic liver disease. Portal vein is patent on color Doppler
imaging with normal direction of blood flow towards the liver.

ULTRASOUND HEPATIC ELASTOGRAPHY

Device: Siemens Helix VTQ

Patient position: Supine

Transducer: [id number]

Number of measurements: 10

Hepatic segment:  8

Median kPa:

IQR:

IQR/Median kPa ratio:

Data quality:  Good

Diagnostic category: < or = 9 kPa: in the absence of other known
clinical signs, rules out cACLD

The use of hepatic elastography is applicable to patients with viral
hepatitis and non-alcoholic fatty liver disease. At this time, there
is insufficient data for the referenced cut-off values and use in
other causes of liver disease, including alcoholic liver disease.
Patients, however, may be assessed by elastography and serve as
their own reference standard/baseline.

In patients with non-alcoholic liver disease, the values suggesting
compensated advanced chronic liver disease (cACLD) may be lower, and
patients may need additional testing with elasticity results of [DATE]
kPa.

Please note that abnormal hepatic elasticity and shear wave
velocities may also be identified in clinical settings other than
with hepatic fibrosis, such as: acute hepatitis, elevated right
heart and central venous pressures including use of beta blockers,
Auntyjatty disease ([place]), infiltrative processes such as
mastocytosis/amyloidosis/infiltrative tumor/lymphoma, extrahepatic
cholestasis, with hyperemia in the post-prandial state, and with
liver transplantation. Correlation with patient history, laboratory
data, and clinical condition recommended.

Diagnostic Categories:

< or =5 kPa: high probability of being normal

< or =9 kPa: in the absence of other known clinical signs, rules [DATE] kPa and ?13 kPa: suggestive of cACLD, but needs further testing

>13 kPa: highly suggestive of cACLD

> or =17 kPa: highly suggestive of cACLD with an increased
probability of clinically significant portal hypertension
IMPRESSION: ULTRASOUND LIVER:

Mildly increased echotexture could suggest fatty infiltration or
intrinsic liver disease.

ULTRASOUND HEPATIC ELASTOGRAPHY:

Median kPa:

Diagnostic category: < or = 9 kPa: in the absence of other known
clinical signs, rules out cACLD
